# Patient Record
Sex: Female | Born: 1995
Health system: Southern US, Community
[De-identification: ages and names within clinical notes are randomized; demographics above are authoritative.]

---
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
## 2016-02-02 ENCOUNTER — Ambulatory Visit (INDEPENDENT_AMBULATORY_CARE_PROVIDER_SITE_OTHER): Payer: BLUE CROSS/BLUE SHIELD | Admitting: Obstetrics and Gynecology

## 2016-02-02 ENCOUNTER — Other Ambulatory Visit: Payer: Self-pay | Admitting: Obstetrics and Gynecology

## 2016-02-02 ENCOUNTER — Encounter: Payer: Self-pay | Admitting: Obstetrics and Gynecology

## 2016-02-02 VITALS — BP 106/66 | HR 84 | Resp 22 | Ht 61.0 in | Wt 119.8 lb

## 2016-02-02 DIAGNOSIS — Z0000 Encounter for general adult medical examination without abnormal findings: Secondary | ICD-10-CM | POA: Diagnosis not present

## 2016-02-02 DIAGNOSIS — Z01419 Encounter for gynecological examination (general) (routine) without abnormal findings: Secondary | ICD-10-CM

## 2016-02-02 DIAGNOSIS — Z113 Encounter for screening for infections with a predominantly sexual mode of transmission: Secondary | ICD-10-CM | POA: Diagnosis not present

## 2016-02-02 LAB — POCT URINALYSIS DIPSTICK
BILIRUBIN UA: NEGATIVE
GLUCOSE UA: NEGATIVE
Ketones, UA: NEGATIVE
Leukocytes, UA: NEGATIVE
NITRITE UA: NEGATIVE
Protein, UA: NEGATIVE
RBC UA: NEGATIVE
Urobilinogen, UA: NEGATIVE
pH, UA: 5

## 2016-02-02 MED ORDER — ETONOGESTREL-ETHINYL ESTRADIOL 0.12-0.015 MG/24HR VA RING: 1.0000 | VAGINAL_RING | VAGINAL | 11 refills | Status: DC

## 2016-02-02 NOTE — Patient Instructions (Signed)
Health Maintenance, Female Adopting a healthy lifestyle and getting preventive care can go a long way to promote health and wellness. Talk with your health care provider about what schedule of regular examinations is right for you. This is a good chance for you to check in with your provider about disease prevention and staying healthy. In between checkups, there are plenty of things you can do on your own. Experts have done a lot of research about which lifestyle changes and preventive measures are most likely to keep you healthy. Ask your health care provider for more information. WEIGHT AND DIET  Eat a healthy diet  Be sure to include plenty of vegetables, fruits, low-fat dairy products, and lean protein.  Do not eat a lot of foods high in solid fats, added sugars, or salt.  Get regular exercise. This is one of the most important things you can do for your health.  Most adults should exercise for at least 150 minutes each week. The exercise should increase your heart rate and make you sweat (moderate-intensity exercise).  Most adults should also do strengthening exercises at least twice a week. This is in addition to the moderate-intensity exercise.  Maintain a healthy weight  Body mass index (BMI) is a measurement that can be used to identify possible weight problems. It estimates body fat based on height and weight. Your health care provider can help determine your BMI and help you achieve or maintain a healthy weight.  For females 20 years of age and older:   A BMI below 18.5 is considered underweight.  A BMI of 18.5 to 24.9 is normal.  A BMI of 25 to 29.9 is considered overweight.  A BMI of 30 and above is considered obese.  Watch levels of cholesterol and blood lipids  You should start having your blood tested for lipids and cholesterol at 20 years of age, then have this test every 5 years.  You may need to have your cholesterol levels checked more often if:  Your lipid  or cholesterol levels are high.  You are older than 20 years of age.  You are at high risk for heart disease.  CANCER SCREENING   Lung Cancer  Lung cancer screening is recommended for adults 55-80 years old who are at high risk for lung cancer because of a history of smoking.  A yearly low-dose CT scan of the lungs is recommended for people who:  Currently smoke.  Have quit within the past 15 years.  Have at least a 30-pack-year history of smoking. A pack year is smoking an average of one pack of cigarettes a day for 1 year.  Yearly screening should continue until it has been 15 years since you quit.  Yearly screening should stop if you develop a health problem that would prevent you from having lung cancer treatment.  Breast Cancer  Practice breast self-awareness. This means understanding how your breasts normally appear and feel.  It also means doing regular breast self-exams. Let your health care provider know about any changes, no matter how small.  If you are in your 20s or 30s, you should have a clinical breast exam (CBE) by a health care provider every 1-3 years as part of a regular health exam.  If you are 40 or older, have a CBE every year. Also consider having a breast X-ray (mammogram) every year.  If you have a family history of breast cancer, talk to your health care provider about genetic screening.  If you   are at high risk for breast cancer, talk to your health care provider about having an MRI and a mammogram every year.  Breast cancer gene (BRCA) assessment is recommended for women who have family members with BRCA-related cancers. BRCA-related cancers include:  Breast.  Ovarian.  Tubal.  Peritoneal cancers.  Results of the assessment will determine the need for genetic counseling and BRCA1 and BRCA2 testing. Cervical Cancer Your health care provider may recommend that you be screened regularly for cancer of the pelvic organs (ovaries, uterus, and  vagina). This screening involves a pelvic examination, including checking for microscopic changes to the surface of your cervix (Pap test). You may be encouraged to have this screening done every 3 years, beginning at age 21.  For women ages 30-65, health care providers may recommend pelvic exams and Pap testing every 3 years, or they may recommend the Pap and pelvic exam, combined with testing for human papilloma virus (HPV), every 5 years. Some types of HPV increase your risk of cervical cancer. Testing for HPV may also be done on women of any age with unclear Pap test results.  Other health care providers may not recommend any screening for nonpregnant women who are considered low risk for pelvic cancer and who do not have symptoms. Ask your health care provider if a screening pelvic exam is right for you.  If you have had past treatment for cervical cancer or a condition that could lead to cancer, you need Pap tests and screening for cancer for at least 20 years after your treatment. If Pap tests have been discontinued, your risk factors (such as having a new sexual partner) need to be reassessed to determine if screening should resume. Some women have medical problems that increase the chance of getting cervical cancer. In these cases, your health care provider may recommend more frequent screening and Pap tests. Colorectal Cancer  This type of cancer can be detected and often prevented.  Routine colorectal cancer screening usually begins at 20 years of age and continues through 20 years of age.  Your health care provider may recommend screening at an earlier age if you have risk factors for colon cancer.  Your health care provider may also recommend using home test kits to check for hidden blood in the stool.  A small camera at the end of a tube can be used to examine your colon directly (sigmoidoscopy or colonoscopy). This is done to check for the earliest forms of colorectal  cancer.  Routine screening usually begins at age 50.  Direct examination of the colon should be repeated every 5-10 years through 20 years of age. However, you may need to be screened more often if early forms of precancerous polyps or small growths are found. Skin Cancer  Check your skin from head to toe regularly.  Tell your health care provider about any new moles or changes in moles, especially if there is a change in a mole's shape or color.  Also tell your health care provider if you have a mole that is larger than the size of a pencil eraser.  Always use sunscreen. Apply sunscreen liberally and repeatedly throughout the day.  Protect yourself by wearing long sleeves, pants, a wide-brimmed hat, and sunglasses whenever you are outside. HEART DISEASE, DIABETES, AND HIGH BLOOD PRESSURE   High blood pressure causes heart disease and increases the risk of stroke. High blood pressure is more likely to develop in:  People who have blood pressure in the high end   of the normal range (130-139/85-89 mm Hg).  People who are overweight or obese.  People who are African American.  If you are 38-23 years of age, have your blood pressure checked every 3-5 years. If you are 61 years of age or older, have your blood pressure checked every year. You should have your blood pressure measured twice--once when you are at a hospital or clinic, and once when you are not at a hospital or clinic. Record the average of the two measurements. To check your blood pressure when you are not at a hospital or clinic, you can use:  An automated blood pressure machine at a pharmacy.  A home blood pressure monitor.  If you are between 45 years and 39 years old, ask your health care provider if you should take aspirin to prevent strokes.  Have regular diabetes screenings. This involves taking a blood sample to check your fasting blood sugar level.  If you are at a normal weight and have a low risk for diabetes,  have this test once every three years after 20 years of age.  If you are overweight and have a high risk for diabetes, consider being tested at a younger age or more often. PREVENTING INFECTION  Hepatitis B  If you have a higher risk for hepatitis B, you should be screened for this virus. You are considered at high risk for hepatitis B if:  You were born in a country where hepatitis B is common. Ask your health care provider which countries are considered high risk.  Your parents were born in a high-risk country, and you have not been immunized against hepatitis B (hepatitis B vaccine).  You have HIV or AIDS.  You use needles to inject street drugs.  You live with someone who has hepatitis B.  You have had sex with someone who has hepatitis B.  You get hemodialysis treatment.  You take certain medicines for conditions, including cancer, organ transplantation, and autoimmune conditions. Hepatitis C  Blood testing is recommended for:  Everyone born from [birth year] through 1965.  Anyone with known risk factors for hepatitis C. Sexually transmitted infections (STIs)  You should be screened for sexually transmitted infections (STIs) including gonorrhea and chlamydia if:  You are sexually active and are younger than 20 years of age.  You are older than 20 years of age and your health care provider tells you that you are at risk for this type of infection.  Your sexual activity has changed since you were last screened and you are at an increased risk for chlamydia or gonorrhea. Ask your health care provider if you are at risk.  If you do not have HIV, but are at risk, it may be recommended that you take a prescription medicine daily to prevent HIV infection. This is called pre-exposure prophylaxis (PrEP). You are considered at risk if:  You are sexually active and do not regularly use condoms or know the HIV status of your partner(s).  You take drugs by injection.  You are sexually  active with a partner who has HIV. Talk with your health care provider about whether you are at high risk of being infected with HIV. If you choose to begin PrEP, you should first be tested for HIV. You should then be tested every 3 months for as long as you are taking PrEP.  PREGNANCY   If you are premenopausal and you may become pregnant, ask your health care provider about preconception counseling.  If you may  become pregnant, take 400 to 800 micrograms (mcg) of folic acid every day.  If you want to prevent pregnancy, talk to your health care provider about birth control (contraception). OSTEOPOROSIS AND MENOPAUSE   Osteoporosis is a disease in which the bones lose minerals and strength with aging. This can result in serious bone fractures. Your risk for osteoporosis can be identified using a bone density scan.  If you are 61 years of age or older, or if you are at risk for osteoporosis and fractures, ask your health care provider if you should be screened.  Ask your health care provider whether you should take a calcium or vitamin D supplement to lower your risk for osteoporosis.  Menopause may have certain physical symptoms and risks.  Hormone replacement therapy may reduce some of these symptoms and risks. Talk to your health care provider about whether hormone replacement therapy is right for you.  HOME CARE INSTRUCTIONS   Schedule regular health, dental, and eye exams.  Stay current with your immunizations.   Do not use any tobacco products including cigarettes, chewing tobacco, or electronic cigarettes.  If you are pregnant, do not drink alcohol.  If you are breastfeeding, limit how much and how often you drink alcohol.  Limit alcohol intake to no more than 1 drink per day for nonpregnant women. One drink equals 12 ounces of beer, 5 ounces of wine, or 1 ounces of hard liquor.  Do not use street drugs.  Do not share needles.  Ask your health care provider for help if  you need support or information about quitting drugs.  Tell your health care provider if you often feel depressed.  Tell your health care provider if you have ever been abused or do not feel safe at home.   This information is not intended to replace advice given to you by your health care provider. Make sure you discuss any questions you have with your health care provider.   Document Released: 01/11/2011 Document Revised: 07/19/2014 Document Reviewed: 05/30/2013 Elsevier Interactive Patient Education [employer].

## 2016-02-02 NOTE — Progress Notes (Signed)
20 y.o. G0P0000 Single African American female here for annual exam.    Mother present for the visit today.   Wants STD testing and wants to get on birth control.  Also asking for routine labs.  Heavy menses, cramps and headaches.  Headaches just prior to and during menses.  This is mostly for July 2017 and Is not a recurrent issue. Some nausea.  No migraine headaches.   Interested in the Ortho Evra patch.  Uses condoms.   Works for [employer].  Therapist, occupational major.   PCP:  None   Patient's last menstrual period was 01/23/2016 (exact date).     Period Cycle (Days): 30 Period Duration (Days): 5-6 Period Pattern: Regular Menstrual Flow: Heavy Menstrual Control: Tampon, Maxi pad Menstrual Control Change Freq (Hours): every 3 hours on heaviest day Dysmenorrhea: (!) Mild Dysmenorrhea Symptoms: Cramping, Nausea, Headache     Sexually active: Yes.   Female The current method of family planning is condoms most of the time--not currently sexually active.    Exercising: Yes.    runs every other day. Smoker:  no  Health Maintenance: Pap:  never History of abnormal Pap:  n/a MMG:  n/a Colonoscopy:  n/a BMD:   n/a  Result  n/a TDaP:  2015 Gardasil:   no HIV: today. Hep C: today. Screening Labs:  Hb today: 12.1, Urine today: Neg   reports that she has never smoked. She does not have any smokeless tobacco history on file. She reports that she does not drink alcohol or use drugs.  No past medical history on file.  No past surgical history on file.  No current outpatient prescriptions on file.   No current facility-administered medications for this visit.     Family History  Problem Relation Age of Onset  . Diabetes Mother   . Hypertension Mother   . Hyperlipidemia Mother   . Hypertension Father   . Diabetes Maternal Grandmother   . Hypertension Maternal Grandmother   . Hypertension Maternal Grandfather     ROS:  Pertinent items are noted in HPI.  Otherwise, a  comprehensive ROS was negative.  Exam:   BP 106/66 (BP Location: Right Arm, Patient Position: Sitting, Cuff Size: Normal)   Pulse 84   Resp (!) 22   Ht 5\' 1"  (1.549 m)   Wt 119 lb 12.8 oz (54.3 kg)   LMP 01/23/2016 (Exact Date)   BMI 22.64 kg/m     General appearance: alert, cooperative and appears stated age Head: Normocephalic, without obvious abnormality, atraumatic Neck: no adenopathy, supple, symmetrical, trachea midline and thyroid normal to inspection and palpation Lungs: clear to auscultation bilaterally Breasts: normal appearance, no masses or tenderness, Inspection negative, No nipple retraction or dimpling, No nipple discharge or bleeding, No axillary or supraclavicular adenopathy Heart: regular rate and rhythm Abdomen: incisions:  No.    , soft, non-tender; no masses, no organomegaly Extremities: extremities normal, atraumatic, no cyanosis or edema Skin: Skin color, texture, turgor normal. No rashes or lesions Lymph nodes: Cervical, supraclavicular, and axillary nodes normal. No abnormal inguinal nodes palpated Neurologic: Grossly normal  Pelvic: External genitalia:  no lesions              Urethra:  normal appearing urethra with no masses, tenderness or lesions              Bartholins and Skenes: normal                 Vagina: normal appearing vagina with normal  color and discharge, no lesions.  Thick curd like discharge with some odor.  3 - 4 mm skin tag/polyp of the left vaginal side wall.               Cervix: no lesions              Pap taken: No. Bimanual Exam:  Uterus:  normal size, contour, position, consistency, mobility, non-tender              Adnexa: normal adnexa and no mass, fullness, tenderness    Chaperone was present for exam.  Assessment:   Well woman visit with normal exam. Heavy menses.  Dysmenorrhea. Desire for STD screening.  Vaginal skin tag/polyp.  Plan: Yearly mammogram recommended after age 42.  Recommended self breast exam.  Pap and  HR HPV as above. Discussed Calcium, Vitamin D, regular exercise program including cardiovascular and weight bearing exercise. Labs performed.  Yes.  .   See orders.  STD screening including Affirm and general labs. Discussion of birth control options - pills, ring, patch, Depo Provera, Nexplanon, IUDs.  Prescription medication(s) given.  Yes.  .  See orders.  Nuva Ring for 12 months. Instructed in used. Warning signs given.  I discussed risk of DVT, PE, MI, and stroke. Follow up in 3 - 4 months for a recheck and BP check.  Follow up annually and prn.      After visit summary provided.

## 2016-02-03 DIAGNOSIS — Z01419 Encounter for gynecological examination (general) (routine) without abnormal findings: Secondary | ICD-10-CM | POA: Diagnosis not present

## 2016-02-03 DIAGNOSIS — Z113 Encounter for screening for infections with a predominantly sexual mode of transmission: Secondary | ICD-10-CM | POA: Diagnosis not present

## 2016-02-03 LAB — LIPID PANEL
CHOL/HDL RATIO: 2.4 ratio (ref <=5.0)
CHOLESTEROL: 169 mg/dL (ref 125–170)
HDL: 71 mg/dL (ref 36–76)
LDL Cholesterol: 86 mg/dL (ref <110)
Triglycerides: 58 mg/dL (ref 40–136)
VLDL: 12 mg/dL (ref <30)

## 2016-02-03 LAB — COMPREHENSIVE METABOLIC PANEL
ALK PHOS: 50 U/L (ref 47–176)
ALT: 10 U/L (ref 5–32)
AST: 16 U/L (ref 12–32)
Albumin: 4.4 g/dL (ref 3.6–5.1)
BILIRUBIN TOTAL: 0.8 mg/dL (ref 0.2–1.1)
BUN: 9 mg/dL (ref 7–20)
CALCIUM: 9.1 mg/dL (ref 8.9–10.4)
CO2: 24 mmol/L (ref 20–31)
Chloride: 105 mmol/L (ref 98–110)
Creat: 0.73 mg/dL (ref 0.50–1.00)
Glucose, Bld: 95 mg/dL (ref 65–99)
POTASSIUM: 3.9 mmol/L (ref 3.8–5.1)
Sodium: 138 mmol/L (ref 135–146)
TOTAL PROTEIN: 7.2 g/dL (ref 6.3–8.2)

## 2016-02-03 LAB — CBC
HEMATOCRIT: 37 % (ref 35.0–45.0)
Hemoglobin: 12.1 g/dL (ref 11.7–15.5)
MCH: 27.8 pg (ref 27.0–33.0)
MCHC: 32.7 g/dL (ref 32.0–36.0)
MCV: 85.1 fL (ref 80.0–100.0)
MPV: 12.6 fL — AB (ref 7.5–12.5)
PLATELETS: 189 10*3/uL (ref 140–400)
RBC: 4.35 MIL/uL (ref 3.80–5.10)
RDW: 14.9 % (ref 11.0–15.0)
WBC: 6.2 10*3/uL (ref 3.8–10.8)

## 2016-02-03 LAB — STD PANEL
HIV 1&2 Ab, 4th Generation: NONREACTIVE
Hepatitis B Surface Ag: NEGATIVE

## 2016-02-03 LAB — HEMOGLOBIN, FINGERSTICK: Hemoglobin, fingerstick: 12.1 g/dL (ref 12.0–16.0)

## 2016-02-03 LAB — TSH: TSH: 1.53 mIU/L (ref 0.50–4.30)

## 2016-02-03 LAB — GC/CHLAMYDIA PROBE AMP
CT PROBE, AMP APTIMA: NOT DETECTED
GC PROBE AMP APTIMA: NOT DETECTED

## 2016-02-03 LAB — HEPATITIS C ANTIBODY: HCV Ab: NEGATIVE

## 2016-02-04 ENCOUNTER — Telehealth: Payer: Self-pay

## 2016-02-04 LAB — WET PREP BY MOLECULAR PROBE
Candida species: POSITIVE — AB
Gardnerella vaginalis: POSITIVE — AB
Trichomonas vaginosis: NEGATIVE

## 2016-02-04 MED ORDER — METRONIDAZOLE 500 MG PO TABS: 500.0000 mg | ORAL_TABLET | Freq: Two times a day (BID) | ORAL | 0 refills | Status: DC

## 2016-02-04 MED ORDER — FLUCONAZOLE 150 MG PO TABS: 150.0000 mg | ORAL_TABLET | Freq: Once | ORAL | 0 refills | Status: AC

## 2016-02-04 NOTE — Telephone Encounter (Signed)
Spoke with patient. Results and message as seen below given to patient. Patient is agreeable and verbalizes understanding. Rx for Diflucan 150 mg orally x 1, repeat in 72 hours if symptoms persist #2 0RF and Flagyl 500 mg bid x 7 days #14 0RF sent to pharmacy on file. ETOH precautions given. Patient is agreeable. Recheck appointment scheduled for 06/07/16 with Dr.Silva.  Routing to provider for final review. Patient agreeable to disposition. Will close encounter.

## 2016-02-04 NOTE — Telephone Encounter (Signed)
-----   Message from Nunzio Cobbs, MD sent at 02/04/2016  6:12 AM EDT ----- Please report results to patient.  She has both yeast and bacterial vaginosis on her Affirm testing.  I am recommending:  Diflucan 150 mg orally x 1 with repeat in 72 hours if needed.  Dispense - 2.  RF none. Flagyl 500 mg po bid for one week.  Dispense - 14.  RF none. Please sent to pharmacy of choice.   Her STD testing is negative for HIV, syphilis, hep B and C, and gonorrhea and chlamydia.  CBC is normal.   Her results for her cholesterol, blood chemistries, and thyroid are all normal.  These came through separately in another result.  Please schedule a follow up visit with me for October or November.  She was just started on the NuvaRing.  Cc- Marisa Sprinkles

## 2016-05-05 DIAGNOSIS — M79671 Pain in right foot: Secondary | ICD-10-CM | POA: Diagnosis not present

## 2016-06-07 ENCOUNTER — Ambulatory Visit: Payer: BLUE CROSS/BLUE SHIELD | Admitting: Obstetrics and Gynecology

## 2016-09-10 ENCOUNTER — Telehealth: Payer: Self-pay | Admitting: Obstetrics and Gynecology

## 2016-09-10 NOTE — Telephone Encounter (Signed)
Patient calling to discuss removing the nuvaring and getting a new form of birth control.

## 2016-09-10 NOTE — Telephone Encounter (Signed)
Trouble with patient phone connection, will return call.

## 2016-09-10 NOTE — Telephone Encounter (Signed)
Spoke with patient. Patient states she would like to change contraception from [place] , no further information provided. Patient states she will be in town for spring break 3/10-3/16. Last AEX 02/02/16, recommended OV to discuss options with Melvia Heaps, CNM, patient scheduled for 3/14 at 10am. Patient is agreeable to date and time.  Routing to provider for final review. Patient is agreeable to disposition. Will close encounter.

## 2016-09-22 ENCOUNTER — Ambulatory Visit (INDEPENDENT_AMBULATORY_CARE_PROVIDER_SITE_OTHER): Payer: BLUE CROSS/BLUE SHIELD | Admitting: Certified Nurse Midwife

## 2016-09-22 ENCOUNTER — Encounter: Payer: Self-pay | Admitting: Certified Nurse Midwife

## 2016-09-22 VITALS — BP 112/80 | HR 64 | Resp 16 | Ht 61.0 in | Wt 131.0 lb

## 2016-09-22 DIAGNOSIS — Z3009 Encounter for other general counseling and advice on contraception: Secondary | ICD-10-CM | POA: Diagnosis not present

## 2016-09-22 NOTE — Progress Notes (Signed)
20 y.o. Single African American G0P0000 here for evaluation of Nuvaring use and discussion of another option for contraception. initiated on 02/02/16 for contraception. Has been using with out any problems until the last 4 months.Menses duration 7 days with       flow. Patient taking medication as prescribed. Denies missed or late insertion of Nuvaring, headaches, nausea, DVT warning signs or symptoms,  breakthrough bleeding. She has experienced vaginal irritation after insertion of new ring for 2 weeks each month for the past 4 months.This occurs, both internal and external. When Nuvaring removed this stops after 2 days. She is tired of dealing with this and would like to use Depo Provera. Sister uses this without any problems other than weight gain.  Keeping menses calendar. Has friends with IUD and Nexplanon and does not feel this is right for her. She works out at gym daily and is very conscious of her weight, so feels this will not be a problem. No other health issues today  O: Healthy female, WD WN Affect: normal orientation X 3    A: History of vaginal irritation and itching with Nuvaring use, desires Depo Provera trial No contraindication of Depo Provera use  P: Discussed possible vaginal infection with Nuvaring use, but patient does not feel this is the case. Has not symptoms today with Nuvaring out. Discussed risks and benefits of Depo Provera and given every 3 months. Discussed bleeding profile expectations. Questions addressed at length. Patient would like trial of Depo Provera. LMP ended 3-4 days ago. Plans condom use until starts Depo Provera and aware she will need to use after injection. Instructed needs to call with next period, so she can start Depo on  day 1-5 of cycle. Patient voiced understanding and will call. Given written literature regarding Depo.  Rv as above, prn  29 minutes in face to face consult regarding contraception change

## 2016-09-22 NOTE — Patient Instructions (Signed)

## 2016-09-27 NOTE — Progress Notes (Signed)
Encounter reviewed Jill Jertson, MD   

## 2016-10-18 ENCOUNTER — Telehealth: Payer: Self-pay | Admitting: Certified Nurse Midwife

## 2016-10-18 NOTE — Telephone Encounter (Signed)
Spoke with patient. Patient states that she started her menses today and would like to schedule depo provera injection. Advised this will need to be given by Friday 10/22/2016. Patient is out of town for school and cannot come to town until Friday. Appointment scheduled for 10/22/2016 at 2 pm. Patient is agreeable to date and time.  Routing to provider for final review. Patient agreeable to disposition. Will close encounter.

## 2016-10-18 NOTE — Telephone Encounter (Signed)
Patient's period started today and she's calling to schedule the depo shot. She's currently out of town and will be back on Friday.

## 2016-10-22 ENCOUNTER — Ambulatory Visit (INDEPENDENT_AMBULATORY_CARE_PROVIDER_SITE_OTHER): Payer: BLUE CROSS/BLUE SHIELD

## 2016-10-22 VITALS — BP 118/60 | HR 88 | Resp 16 | Wt 133.0 lb

## 2016-10-22 DIAGNOSIS — Z3042 Encounter for surveillance of injectable contraceptive: Secondary | ICD-10-CM

## 2016-10-22 MED ORDER — MEDROXYPROGESTERONE ACETATE 150 MG/ML IM SUSP
150.0000 mg | Freq: Once | INTRAMUSCULAR | Status: AC
  Administered 2016-10-22: 150 mg via INTRAMUSCULAR

## 2016-10-22 NOTE — Progress Notes (Signed)
Patient is here for Depo Provera Injection Patient is within Depo Provera Calender Limits first depo: okay per DL Per patient, has had unprotected sex since last visit with DL. Spoke with Dr. Quincy Simmonds, okay to administer injection. Patient is currently on period. Start date: 10/18/16 Next Depo Due between: 6/29-7/13 Last AEX: 02/02/16 BS AEX Scheduled: not scheduled  Patient is aware when next depo is due  Pt tolerated Injection well in [place].  Routed to provider for review, encounter closed.

## 2016-12-08 ENCOUNTER — Telehealth: Payer: Self-pay | Admitting: Certified Nurse Midwife

## 2016-12-08 NOTE — Telephone Encounter (Signed)
Spoke with patient. Patient reports starting depo-provera in April 2018, has been bleeding for 3 weeks, would like to know if this is normal. Patient reports changing a pad 2 times per day, denies fatigue, dizziness, weakness, pain. Patient states no chance of pregnancy. Advised patient can take 3 months with new contraceptive for cycles to regulate, continue to monitor. Return call if bleeding becomes heavy -changing pad or tampon q1-2 hours or if fatigue/dizziness/lightheadness develop. Advised patient would review with Melvia Heaps, CNM and return call with any additional recommendations, patient is agreeable.  Melvia Heaps, CNM -any additional recommendations?

## 2016-12-08 NOTE — Telephone Encounter (Signed)
Left message to call Alya Smaltz at [phone].  

## 2016-12-08 NOTE — Telephone Encounter (Signed)
Patient is asking to talk with a nurse regarding her depo provera. Patient is having irregular bleeding.

## 2016-12-08 NOTE — Telephone Encounter (Signed)
Agree with plan. This is not unusual with first using Depo Provera

## 2017-01-07 ENCOUNTER — Ambulatory Visit (INDEPENDENT_AMBULATORY_CARE_PROVIDER_SITE_OTHER): Payer: BLUE CROSS/BLUE SHIELD

## 2017-01-07 VITALS — BP 104/64 | HR 68 | Resp 16 | Ht 61.0 in | Wt 136.0 lb

## 2017-01-07 DIAGNOSIS — Z3042 Encounter for surveillance of injectable contraceptive: Secondary | ICD-10-CM | POA: Diagnosis not present

## 2017-01-07 MED ORDER — MEDROXYPROGESTERONE ACETATE 150 MG/ML IM SUSP
150.0000 mg | Freq: Once | INTRAMUSCULAR | Status: AC
Start: 2017-01-07 — End: 2017-01-07
  Administered 2017-01-07: 150 mg via INTRAMUSCULAR

## 2017-01-07 NOTE — Progress Notes (Signed)
Patient is here for Depo Provera Injection Patient is within Depo Provera Calender Limits 01-07-17 thru 01-21-17 Next Depo Due between: 03-25-17 thru 04-08-17 Last AEX: 02-02-16 with deborah leonard,cnm AEX Scheduled: not scheduled pt aware needs to be scheduled before next depo.pt to schedule  Patient is aware when next depo is due  Pt tolerated Injection well. Given in [place]  Routed to provider for review, encounter closed.

## 2017-01-31 ENCOUNTER — Telehealth: Payer: Self-pay | Admitting: Obstetrics and Gynecology

## 2017-01-31 NOTE — Telephone Encounter (Signed)
Patient called and requested to speak with the nurse about her birth control.

## 2017-01-31 NOTE — Telephone Encounter (Signed)
Please move up the patient's annual exam with me. She has her appointment with me the beginning of August. How about tomorrow morning instead?  (Her last annual was 03/04/16.) I can assess her bleeding then and present options.

## 2017-01-31 NOTE — Telephone Encounter (Signed)
Spoke with patient. Patient states since her first Depo Provera injection in April she has been having irregular bleeding. Most recent Depo was given on 01/07/17. Has been having ongoing light bleeding until 2 weeks ago. Bleeding increased to a normal menses flow. Is changing her tampon every 4 hours. Reports bleeding is not getting lighter. Denies any fatigue, weakness, or light headedness. Denies any chance for pregnancy. States she was advised to contact the office if bleeding increased so she could potentially get started on a medication to help regulate her bleeding.  Routing to [place] for review and Melvia Heaps CNM is out of the office.

## 2017-01-31 NOTE — Telephone Encounter (Signed)
Spoke with patient. Advised of recommendations from Dr. Quincy Simmonds. Patient is agreeable. Last aex was 02/02/16. This years aex needs to be 1 year and 1 day out for insurance coverage. Aex scheduled for 02/02/2017 at 8:15 am with Dr.Silva. Patient is agreeable to date and time.  Routing to provider for final review. Patient agreeable to disposition. Will close encounter.

## 2017-02-02 ENCOUNTER — Encounter: Payer: Self-pay | Admitting: Obstetrics and Gynecology

## 2017-02-02 ENCOUNTER — Ambulatory Visit (INDEPENDENT_AMBULATORY_CARE_PROVIDER_SITE_OTHER): Payer: BLUE CROSS/BLUE SHIELD | Admitting: Obstetrics and Gynecology

## 2017-02-02 VITALS — BP 108/70 | HR 84 | Resp 16 | Ht 62.0 in | Wt 137.0 lb

## 2017-02-02 DIAGNOSIS — Z23 Encounter for immunization: Secondary | ICD-10-CM | POA: Diagnosis not present

## 2017-02-02 DIAGNOSIS — Z01419 Encounter for gynecological examination (general) (routine) without abnormal findings: Secondary | ICD-10-CM

## 2017-02-02 DIAGNOSIS — N939 Abnormal uterine and vaginal bleeding, unspecified: Secondary | ICD-10-CM | POA: Diagnosis not present

## 2017-02-02 DIAGNOSIS — Z113 Encounter for screening for infections with a predominantly sexual mode of transmission: Secondary | ICD-10-CM

## 2017-02-02 DIAGNOSIS — R946 Abnormal results of thyroid function studies: Secondary | ICD-10-CM | POA: Diagnosis not present

## 2017-02-02 DIAGNOSIS — R7989 Other specified abnormal findings of blood chemistry: Secondary | ICD-10-CM

## 2017-02-02 LAB — POCT URINE PREGNANCY: Preg Test, Ur: NEGATIVE

## 2017-02-02 MED ORDER — ESTRADIOL 0.5 MG PO TABS: 0.5000 mg | ORAL_TABLET | Freq: Every day | ORAL | 0 refills | Status: DC

## 2017-02-02 NOTE — Progress Notes (Signed)
21 y.o. G0P0000 Single African American female here for annual exam.  Patient complains of heavy bleeding after getting last depo injection. Would like to discuss other options Has received 2 depo injections.  States she was spotting after the first injection.  This has increased with the second injection and to the point of changing a tampon 3 times per day.  Seems to wax and wane per patient.  Other than the bleeding, the patient likes the Depo.    She wants to do a break after this Depo Provera injection.   In the past has used NuvaRing.  She states she had irregular bleeding with the NuvaRing.   She started on contraception in July 2017 for dysmenorrhea and heavy cycles.  Is sexually active.  No new partner.   UPT -   PCP:   No PCP  No LMP recorded. Patient has had an injection.           Sexually active: Yes.    The current method of family planning is Depo-Provera injections.    Exercising: No.  The patient does not participate in regular exercise at present. Smoker:  no  Health Maintenance: Pap:  Never History of abnormal Pap:  n/a MMG:  N/a Colonoscopy:  n/a BMD:   n/a  Result  n/a TDaP:  2015 Gardasil:  unsure HIV and Hep C: 02/02/16 Negative Screening Labs:     reports that she has never smoked. She has never used smokeless tobacco. She reports that she does not drink alcohol or use drugs.  History reviewed. No pertinent past medical history.  History reviewed. No pertinent surgical history.  Current Outpatient Prescriptions  Medication Sig Dispense Refill  . medroxyPROGESTERone (DEPO-PROVERA) 150 MG/ML injection Inject 150 mg into the muscle every 3 (three) months.     No current facility-administered medications for this visit.     Family History  Problem Relation Age of Onset  . Diabetes Mother   . Hypertension Mother   . Hyperlipidemia Mother   . Hypertension Father   . Diabetes Maternal Grandmother   . Hypertension Maternal Grandmother   .  Hypertension Maternal Grandfather     ROS:  Pertinent items are noted in HPI.  Otherwise, a comprehensive ROS was negative.  Exam:   BP 108/70 (BP Location: Right Arm, Patient Position: Sitting, Cuff Size: Normal)   Pulse 84   Resp 16   Ht 5\' 2"  (1.575 m)   Wt 137 lb (62.1 kg)   BMI 25.06 kg/m     General appearance: alert, cooperative and appears stated age Head: Normocephalic, without obvious abnormality, atraumatic Neck: no adenopathy, supple, symmetrical, trachea midline and thyroid normal to inspection and palpation Lungs: clear to auscultation bilaterally Breasts: normal appearance, no masses or tenderness, No nipple retraction or dimpling, No nipple discharge or bleeding, No axillary or supraclavicular adenopathy Heart: regular rate and rhythm Abdomen: soft, non-tender; no masses, no organomegaly Extremities: extremities normal, atraumatic, no cyanosis or edema Skin: Skin color, texture, turgor normal. No rashes or lesions Lymph nodes: Cervical, supraclavicular, and axillary nodes normal. No abnormal inguinal nodes palpated Neurologic: Grossly normal  Pelvic: External genitalia:  no lesions              Urethra:  normal appearing urethra with no masses, tenderness or lesions              Bartholins and Skenes: normal                 Vagina: normal  appearing vagina with normal color and discharge, no lesions.  Brownish blood.               Cervix: no lesions              Pap taken: No. Bimanual Exam:  Uterus:  normal size, contour, position, consistency, mobility, non-tender              Adnexa: no mass, fullness, tenderness                Chaperone was present for exam.  Assessment:   Well woman visit with normal exam. Abnormal uterine bleeding.  Had irregular bleeding with NuvaRing and Depo but not prior to this.  STD screening.   Plan: Mammogram screening discussed. Recommended self breast awareness. Pap and HR HPV as above. Guidelines for Calcium, Vitamin D,  regular exercise program including cardiovascular and weight bearing exercise. STD testing, CBC, and TSH.  Start [facility] series.  Discussed with patient.  If UPT negative now, will give Gardasil and also Rx for Estrace 0.5 mg daily for one month.  She will report back how her menstrual cycles are doing.  Follow up annually and prn.   After visit summary provided.

## 2017-02-02 NOTE — Patient Instructions (Signed)

## 2017-02-03 LAB — CBC
HEMOGLOBIN: 12.5 g/dL (ref 11.1–15.9)
Hematocrit: 39.4 % (ref 34.0–46.6)
MCH: 28 pg (ref 26.6–33.0)
MCHC: 31.7 g/dL (ref 31.5–35.7)
MCV: 88 fL (ref 79–97)
Platelets: 150 10*3/uL (ref 150–379)
RBC: 4.47 x10E6/uL (ref 3.77–5.28)
RDW: 14.6 % (ref 12.3–15.4)
WBC: 4.3 10*3/uL (ref 3.4–10.8)

## 2017-02-03 LAB — HEP, RPR, HIV PANEL
HEP B S AG: NEGATIVE
HIV SCREEN 4TH GENERATION: NONREACTIVE
RPR Ser Ql: NONREACTIVE

## 2017-02-03 LAB — TSH: TSH: 4.87 u[IU]/mL — ABNORMAL HIGH (ref 0.450–4.500)

## 2017-02-03 LAB — HEPATITIS C ANTIBODY: Hep C Virus Ab: 0.7 s/co ratio (ref 0.0–0.9)

## 2017-02-04 ENCOUNTER — Other Ambulatory Visit: Payer: Self-pay | Admitting: *Deleted

## 2017-02-04 ENCOUNTER — Telehealth: Payer: Self-pay | Admitting: Obstetrics and Gynecology

## 2017-02-04 LAB — VAGINITIS/VAGINOSIS, DNA PROBE
CANDIDA SPECIES: NEGATIVE
Gardnerella vaginalis: POSITIVE — AB
Trichomonas vaginosis: NEGATIVE

## 2017-02-04 NOTE — Telephone Encounter (Signed)
Phone call to patient.  Her Affirm was positive for BV and negative for yeast and trichomonas. She is not having any symptoms of discharge, odor, or burning.  She declines tx at this time.   Her TSH was elevated and I asked to add free T3 and free T4 to her lab work already drawn.  I explained that we need this information to determine if she has hypothyroidism and if she needs treatment for this.  If thyroid hormone levels are normal, will retest TSH in 6 weeks.

## 2017-02-06 NOTE — Addendum Note (Signed)
Addended by: Yisroel Ramming, BROOK E on: 02/06/2017 11:21 AM   Modules accepted: Orders

## 2017-02-07 ENCOUNTER — Telehealth: Payer: Self-pay | Admitting: *Deleted

## 2017-02-07 LAB — GC/CHLAMYDIA PROBE AMP

## 2017-02-07 NOTE — Telephone Encounter (Signed)
Notes recorded by Burnice Logan, RN on 02/07/2017 at 11:45 AM EDT Left message to call [name] at [phone].   Notes recorded by Nunzio Cobbs, MD on 02/06/2017 at 11:21 AM EDT Please contact patient with her final thyroid testing.  She had a mildly elevated TSH, and her thyroid hormone levels are normal. She needs to have her TSH rechecked again in 6 weeks.  Please schedule this lab visit.  I will place a future order. ------

## 2017-02-07 NOTE — Telephone Encounter (Signed)
Patient returning your call.  States it is ok to leave a detailed message.

## 2017-02-07 NOTE — Telephone Encounter (Signed)
Call to patient results reviewed with patient and she verbalized understanding. Patient scheduled to have TSH rechecked on Friday 03/25/17 at 1430. Patient agreeable to date and time of appointment. Future order present for blood work.   Patient agreeable to disposition. Will close encounter.

## 2017-02-08 LAB — T3, FREE: T3 FREE: 4.1 pg/mL (ref 2.0–4.4)

## 2017-02-08 LAB — T4, FREE: Free T4: 1.44 ng/dL (ref 0.82–1.77)

## 2017-02-08 LAB — SPECIMEN STATUS REPORT

## 2017-02-08 NOTE — Addendum Note (Signed)
Addended by: Yisroel Ramming, BROOK E on: 02/08/2017 10:52 AM   Modules accepted: Orders

## 2017-02-09 ENCOUNTER — Ambulatory Visit: Payer: BLUE CROSS/BLUE SHIELD | Admitting: Obstetrics and Gynecology

## 2017-02-28 ENCOUNTER — Telehealth: Payer: Self-pay | Admitting: Obstetrics and Gynecology

## 2017-02-28 NOTE — Telephone Encounter (Signed)
I would recommend no further Estrace at this time until further evaluation.  If the bleeding becomes heavy with pad change every 1 - 2 hours, I recommend moving up the office visit.

## 2017-02-28 NOTE — Telephone Encounter (Signed)
Spoke with patient. Patient is currently on Depo Provera for birth control. Has had 2 injections. Last injection given on 12/28/16. Was having ongoing bleeding after 2nd injection and was seen in the office on 02/02/17 for evaluation with Dr.Silva. Patient was started on Estrace 0.5 mg daily. Reports she has not had any bleeding until 02/25/17. Bleeding has returned. Reports passing multiple clots. Wearing a pad she changes 1-2 times daily. Denies any heavy bleeding. Next injection is due 03/31/17. Patient is concerned about bleeding returning and persisting like before starting Estrace. Advised will review with Dr.Silva and return call with recommendations.

## 2017-02-28 NOTE — Telephone Encounter (Signed)
Please schedule appointment for the patient to see me for a recheck.

## 2017-02-28 NOTE — Telephone Encounter (Signed)
Spoke with patient. Advised of message as seen below from [place]. Appointment scheduled for 03/11/17 at 3 pm with Dr.Silva. Patient is agreeable to date and time. Declines all earlier appointments due to work and school schedule.  Dr.Silva, would you like me to refill Estrace for 1 month until she is able to be seen?

## 2017-02-28 NOTE — Telephone Encounter (Signed)
Patient has a question about her birth control. °

## 2017-03-01 NOTE — Telephone Encounter (Signed)
Spoke with patient. Advised of message as seen below from Dr.Silva. Patient verbalizes understanding. 

## 2017-03-10 NOTE — Progress Notes (Signed)
GYNECOLOGY  VISIT   HPI: 21 y.o.   Single  African American  female   [place] with Patient's last menstrual period was 02/23/2017.   here for Abnormal uterine bleeding f/u and recheck TSH/urine GC.  Did a one month course of Estrace 0.5 mg for irregular bleeding on Depo Provera.  This stopped the bleeding for 3 weeks, and then it recurred, but now she ran out.  Still bleeding today but it is light.  No pain or discomfort.   Depo Provera is due next week.  This would be her third injection.  Wants to continue this and the estrogen pills.  Going to the beach next week.   State she did have irregular bleeding on NuvaRing also.   GYNECOLOGIC HISTORY: Patient's last menstrual period was 02/23/2017. Contraception:  Depo-Provera Menopausal hormone therapy:  n/a Last mammogram:  n/a Last pap smear:   n/a        OB History    Gravida Para Term Preterm AB Living   0 0 0 0 0 0   SAB TAB Ectopic Multiple Live Births   0 0 0 0 0         There are no active problems to display for this patient.   History reviewed. No pertinent past medical history.  History reviewed. No pertinent surgical history.  Current Outpatient Prescriptions  Medication Sig Dispense Refill  . medroxyPROGESTERone (DEPO-PROVERA) 150 MG/ML injection Inject 150 mg into the muscle every 3 (three) months.    Marland Kitchen estradiol (ESTRACE) 0.5 MG tablet Take 1 tablet (0.5 mg total) by mouth daily. (Patient not taking: Reported on 03/11/2017) 30 tablet 0   No current facility-administered medications for this visit.      ALLERGIES: Patient has no known allergies.  Family History  Problem Relation Age of Onset  . Diabetes Mother   . Hypertension Mother   . Hyperlipidemia Mother   . Hypertension Father   . Diabetes Maternal Grandmother   . Hypertension Maternal Grandmother   . Hypertension Maternal Grandfather     Social History   Social History  . Marital status: Single    Spouse name: N/A  . Number of  children: N/A  . Years of education: N/A   Occupational History  . Not on file.   Social History Main Topics  . Smoking status: Never Smoker  . Smokeless tobacco: Never Used  . Alcohol use No  . Drug use: No  . Sexual activity: Yes    Partners: Male    Birth control/ protection: Injection   Other Topics Concern  . Not on file   Social History Narrative  . No narrative on file    ROS:  Pertinent items are noted in HPI.  PHYSICAL EXAMINATION:    BP 100/70 (BP Location: Right Arm, Patient Position: Sitting, Cuff Size: Normal)   Pulse 96   Resp 16   Wt 138 lb (62.6 kg)   LMP 02/23/2017   BMI 25.24 kg/m     General appearance: alert, cooperative and appears stated age  Pelvic: External genitalia:  no lesions              Urethra:  normal appearing urethra with no masses, tenderness or lesions              Bartholins and Skenes: normal                 Vagina: normal appearing vagina with normal color and discharge, no lesions  Cervix: no lesions.  Small amount of old blood.                 Bimanual Exam:  Uterus:  normal size, contour, position, consistency, mobility, non-tender              Adnexa: no mass, fullness, tenderness               Chaperone was present for exam.  ASSESSMENT  Irregular bleeding on Depo Provera.  Bleeding was improved but not completely resolved on Estrace 0.5 mg.  Elevated TSH.  PLAN  UPT. Depo Provera 150 mg today if UPT negative.  Urine GC/CT. OK for Estrace 1 mg daily x 3 months.  If irregular bleeding persists, will do pelvic US.  TSH, free T3 and free T4.   An After Visit Summary was printed and given to the patient.  __15____ minutes face to face time of which over 50% was spent in counseling.

## 2017-03-11 ENCOUNTER — Ambulatory Visit (INDEPENDENT_AMBULATORY_CARE_PROVIDER_SITE_OTHER): Payer: BLUE CROSS/BLUE SHIELD | Admitting: Obstetrics and Gynecology

## 2017-03-11 ENCOUNTER — Encounter: Payer: Self-pay | Admitting: Obstetrics and Gynecology

## 2017-03-11 VITALS — BP 100/70 | HR 96 | Resp 16 | Wt 138.0 lb

## 2017-03-11 DIAGNOSIS — R7989 Other specified abnormal findings of blood chemistry: Secondary | ICD-10-CM

## 2017-03-11 DIAGNOSIS — R946 Abnormal results of thyroid function studies: Secondary | ICD-10-CM | POA: Diagnosis not present

## 2017-03-11 DIAGNOSIS — Z113 Encounter for screening for infections with a predominantly sexual mode of transmission: Secondary | ICD-10-CM

## 2017-03-11 DIAGNOSIS — Z3042 Encounter for surveillance of injectable contraceptive: Secondary | ICD-10-CM

## 2017-03-11 DIAGNOSIS — N926 Irregular menstruation, unspecified: Secondary | ICD-10-CM

## 2017-03-11 LAB — POCT URINE PREGNANCY: PREG TEST UR: NEGATIVE

## 2017-03-11 MED ORDER — MEDROXYPROGESTERONE ACETATE 150 MG/ML IM SUSP
150.0000 mg | Freq: Once | INTRAMUSCULAR | Status: AC
  Administered 2017-03-11: 150 mg via INTRAMUSCULAR

## 2017-03-11 MED ORDER — ESTRADIOL 1 MG PO TABS: 1.0000 mg | ORAL_TABLET | Freq: Every day | ORAL | 2 refills | Status: DC

## 2017-03-12 LAB — T3, FREE: T3, Free: 3.3 pg/mL (ref 2.0–4.4)

## 2017-03-12 LAB — TSH: TSH: 1.68 u[IU]/mL (ref 0.450–4.500)

## 2017-03-12 LAB — T4, FREE: FREE T4: 1.26 ng/dL (ref 0.82–1.77)

## 2017-03-13 LAB — GC/CHLAMYDIA PROBE AMP
Chlamydia trachomatis, NAA: NEGATIVE
Neisseria gonorrhoeae by PCR: NEGATIVE

## 2017-03-25 ENCOUNTER — Ambulatory Visit: Payer: BLUE CROSS/BLUE SHIELD

## 2017-03-25 ENCOUNTER — Other Ambulatory Visit: Payer: Self-pay

## 2017-04-08 ENCOUNTER — Ambulatory Visit: Payer: BLUE CROSS/BLUE SHIELD

## 2017-04-13 ENCOUNTER — Telehealth: Payer: Self-pay | Admitting: Obstetrics and Gynecology

## 2017-04-13 NOTE — Telephone Encounter (Signed)
Please have patient return to the office for a pelvic ultrasound and recheck with me.  She is having abnormal bleeding on various contraceptives. Do not start the estradiol at this time.  I can see her tomorrow morning if that works for her.   Danielle Phelps

## 2017-04-13 NOTE — Telephone Encounter (Signed)
Call to patient. States she cannot come in for appointment. Offered Friday, Monday and multiple options. Patient declines all appointments until Friday 04-22-17. Instructed to call back if decides she can come sooner.  Given precautions to go to urgent care or ED if increased bleeding > I pad per hour, dizziness, lightheaded, SOB or palpitations. Currently denies these symptoms.  Advised not to restart Estradiol at this time per Dr Quincy Simmonds.  Advised will update Dr Quincy Simmonds on appointment plans and will call her back if any additional instructions.   Routing to provider for final review. Patient agreeable to disposition. Will close encounter.

## 2017-04-13 NOTE — Telephone Encounter (Signed)
Call to patient. Discussed option of ultrasound tomorrow morning. Patient will check with family and see if she can make arrangements for this and call back.

## 2017-04-13 NOTE — Telephone Encounter (Signed)
Spoke with patient. Reports is on depo-provera, takes estradiol 1mg  daily for irregular bleeding.   Ran out of estradiol for 3 days, bleeding restarted during this time. Reports flow as heavy, has changed tampon x1 today. Patient states she picked up nex prescription, was unsure if she could restart estradiol?  Reports prior to stopping estradiol 1 mg daily, no irregular bleeding.  Advised patient can restart estradiol today, will update Dr. Quincy Simmonds and return call with any additional recommendations. Patient is agreeable.  Dr. Quincy Simmonds -any additional recommendations?

## 2017-04-13 NOTE — Telephone Encounter (Signed)
Patient on Depo and estradiol and has some questions.

## 2017-04-14 ENCOUNTER — Telehealth: Payer: Self-pay | Admitting: Obstetrics and Gynecology

## 2017-04-14 NOTE — Telephone Encounter (Signed)
Spoke with patient, returning call about estradiol. Patient states she was advised not to restart estradiol 1mg  po, unable to come in for OV until next week, 10/12.  Reports bleeding as "heavy", changing tampon 2 times per day. Denies SHOB, dizziness, weakness or fatigue.  Advised patient per review of telephone encounter dated 10/3 -Dr. Quincy Simmonds advised not to restart estradiol. Will review with Dr. Quincy Simmonds and return call with any additional recommendations, patient is agreeable.   Dr. Quincy Simmonds -any additional recommendations?

## 2017-04-14 NOTE — Telephone Encounter (Signed)
Reviewed with Dr. Quincy Simmonds. Advised not to restart estradiol until further evaluation of abnormal bleeding is completed.   Spoke with patient, advised as seen above. Attempted to move OV to earlier date, patient declined.   Precautions reviewed -seek care at local ER/Urgent care should bleeding increase, changing saturated pad/tampon q1-2 hours, dizziness, lightheaded, SHOB or palpitations.  Patient verbalizes understanding and is agreeable.  Routing to provider for final review. Patient is agreeable to disposition. Will close encounter.

## 2017-04-14 NOTE — Telephone Encounter (Signed)
Patient called with questions about her medications.

## 2017-04-19 ENCOUNTER — Telehealth: Payer: Self-pay | Admitting: Obstetrics and Gynecology

## 2017-04-19 NOTE — Telephone Encounter (Signed)
Left message to call Oryon Gary at [phone].  

## 2017-04-19 NOTE — Telephone Encounter (Signed)
Please contact patient in follow up to her cancelled appointment.  I would like an update on her bleeding.   [place]

## 2017-04-19 NOTE — Telephone Encounter (Signed)
Patient cancelled office visit appointment for abnormal bleeding for Friday. Says she is still having the issue but she has something else to do.

## 2017-04-21 NOTE — Telephone Encounter (Signed)
Spoke with patient. Patient states she would like to reschedule appointment for 10/12, will be back in town after 11am. Scheduled for 1:15pm with Dr. Quincy Simmonds. Patient states her bleeding is "unchanged, still heavy". Changing tampon 2 times per day. Patient states she took estradiol x1 on 10/10 "to see if there would be any difference in bleeding", reports no changes.   Advised patient of recommendations per Dr. Quincy Simmonds, not to restart estradiol until further evaluation of abnormal bleeding is completed. ER precautions reviewed. Patient verbalizes understanding.   Routing to provider for final review. Patient is agreeable to disposition. Will close encounter.

## 2017-04-22 ENCOUNTER — Ambulatory Visit: Payer: Self-pay | Admitting: Obstetrics and Gynecology

## 2017-04-22 ENCOUNTER — Ambulatory Visit: Payer: BLUE CROSS/BLUE SHIELD | Admitting: Obstetrics and Gynecology

## 2017-04-25 ENCOUNTER — Encounter: Payer: Self-pay | Admitting: Obstetrics and Gynecology

## 2017-04-25 ENCOUNTER — Ambulatory Visit (INDEPENDENT_AMBULATORY_CARE_PROVIDER_SITE_OTHER): Payer: BLUE CROSS/BLUE SHIELD | Admitting: Obstetrics and Gynecology

## 2017-04-25 VITALS — BP 116/70 | HR 84 | Temp 98.6°F | Resp 16 | Wt 144.0 lb

## 2017-04-25 DIAGNOSIS — N926 Irregular menstruation, unspecified: Secondary | ICD-10-CM

## 2017-04-25 DIAGNOSIS — R3915 Urgency of urination: Secondary | ICD-10-CM | POA: Diagnosis not present

## 2017-04-25 LAB — POCT URINALYSIS DIPSTICK
Bilirubin, UA: NEGATIVE
Blood, UA: NEGATIVE
GLUCOSE UA: NEGATIVE
KETONES UA: NEGATIVE
LEUKOCYTES UA: NEGATIVE
Nitrite, UA: NEGATIVE
PROTEIN UA: NEGATIVE
Urobilinogen, UA: 0.2 E.U./dL
pH, UA: 5 (ref 5.0–8.0)

## 2017-04-25 LAB — POCT URINE PREGNANCY: PREG TEST UR: NEGATIVE

## 2017-04-25 NOTE — Progress Notes (Signed)
Patient scheduled for PUS while in office, mother present. Call placed to [place], spoke with [place], was advised [place] machine was down, unable to schedule.  Call to [facility] Radiology scheduling, spoke with [facility]. Patient scheduled for PUS at [facility] on 04/26/17 arriving at 8:45am for 9am appointment. Patient aware to arrive with full bladder, drink 32oz water 1 hour prior. Patient verbalizes understanding and is agreeable.

## 2017-04-25 NOTE — Progress Notes (Signed)
GYNECOLOGY  VISIT   HPI: 21 y.o.   Single  African American  female   G0P0000 with No LMP recorded. Patient has had an injection.   here for   Irregular bleeding -- patient also complains of having discomfort with urination and urgency to urinate since last night. Per patient has taken the Estradiol so that bleeding would stop; bleeding decreased Friday 04/22/17.  Pad change twice a day.  Irregular bleeding for 9 months.   No pelvic pain or discomfort.   Hx irregular bleeding with NuvaRing and with Depo Provera.  Had heavy cycles prior to starting NuvaRing with pad change 2 - 3 times a day.   GC/CT both negative 03/11/17.  Normal TFTs 03/11/17.   No regular use of NSAIDs.   Urine: Negative UPT:  Negative.  Patient's mother is present for the visit today.  GYNECOLOGIC HISTORY: No LMP recorded. Patient has had an injection. Contraception:  Depo-Provera Menopausal hormone therapy:  n/a Last mammogram:  n/a Last pap smear:   n/a        OB History    Gravida Para Term Preterm AB Living   0 0 0 0 0 0   SAB TAB Ectopic Multiple Live Births   0 0 0 0 0         There are no active problems to display for this patient.   No past medical history on file.  No past surgical history on file.  Current Outpatient Prescriptions  Medication Sig Dispense Refill  . estradiol (ESTRACE) 1 MG tablet Take 1 tablet (1 mg total) by mouth daily. 30 tablet 2  . medroxyPROGESTERone (DEPO-PROVERA) 150 MG/ML injection Inject 150 mg into the muscle every 3 (three) months.     No current facility-administered medications for this visit.      ALLERGIES: Patient has no known allergies.  Family History  Problem Relation Age of Onset  . Diabetes Mother   . Hypertension Mother   . Hyperlipidemia Mother   . Hypertension Father   . Diabetes Maternal Grandmother   . Hypertension Maternal Grandmother   . Hypertension Maternal Grandfather     Social History   Social History  . Marital  status: Single    Spouse name: N/A  . Number of children: N/A  . Years of education: N/A   Occupational History  . Not on file.   Social History Main Topics  . Smoking status: Never Smoker  . Smokeless tobacco: Never Used  . Alcohol use No  . Drug use: No  . Sexual activity: Yes    Partners: Male    Birth control/ protection: Injection   Other Topics Concern  . Not on file   Social History Narrative  . No narrative on file    ROS:  Pertinent items are noted in HPI.  PHYSICAL EXAMINATION:    BP 116/70 (BP Location: Right Arm, Patient Position: Sitting, Cuff Size: Normal)   Pulse 84   Temp 98.6 F (37 C) (Oral)   Resp 16   Wt 144 lb (65.3 kg)   BMI 26.34 kg/m     General appearance: alert, cooperative and appears stated age   Pelvic: External genitalia:  no lesions              Urethra:  normal appearing urethra with no masses, tenderness or lesions              Bartholins and Skenes: normal  Vagina: normal appearing vagina with normal color and discharge, no lesions              Cervix: no lesions.  [place] old blood.                Bimanual Exam:  Uterus:  normal size, contour, position, consistency, mobility, non-tender              Adnexa: no mass, fullness, tenderness                Chaperone was present for exam.  ASSESSMENT  Irregular vaginal bleeding with contraceptives.  Urinary urgency.  No UTI.  PLAN  I discussed possible etiologies of her bleeding - hormonal contraception, ovarian cysts, fibroids.  Proceed with pelvic ultrasound. She wants to take a break from the hormonal contraception.  Stop estradiol.   An After Visit Summary was printed and given to the patient.  __15____ minutes face to face time of which over 50% was spent in counseling.

## 2017-04-26 ENCOUNTER — Ambulatory Visit (HOSPITAL_COMMUNITY): Payer: BLUE CROSS/BLUE SHIELD

## 2017-04-29 DIAGNOSIS — N10 Acute tubulo-interstitial nephritis: Secondary | ICD-10-CM | POA: Diagnosis not present

## 2017-05-03 ENCOUNTER — Telehealth: Payer: Self-pay | Admitting: Obstetrics and Gynecology

## 2017-05-03 ENCOUNTER — Other Ambulatory Visit: Payer: Self-pay | Admitting: Obstetrics and Gynecology

## 2017-05-03 DIAGNOSIS — N926 Irregular menstruation, unspecified: Secondary | ICD-10-CM

## 2017-05-03 NOTE — Telephone Encounter (Signed)
Spoke with patient. Patient requesting to increase Estradiol, states 1mg  is not effective for bleeding.  Advised patient Dr. Quincy Simmonds advised not to restart Estradiol, PUS recommended for further evaluation. Patient states she did not go to [facility] for PUS on 10/16 d/t the cost.   Patient states she has continued to take estradiol 1mg  daily, reports bleeding as "heavy" changing pad 2-3 times per day.   [facility] on [facility]/18 and was treated for "UTI that turned into kidney infection". Currently taking Cipro 500 mg bid x7 days. States symptoms are improving, has not received culture results.   Reviewed recommendations per OV dated 10/15, stop estradiol, PUS for further evaluation. Offered to schedule PUS in office for 10/25, patient declined. Patient states she goes to school in [place] and is hard to get back to [place]. RN offered assistance in scheduling PUS in [place], patient states she would like to look into options for imaging that the school may offer and return call.  Advised patient will review with Dr. Quincy Simmonds and return call with any additional recommendations. Advised patient Dr. Quincy Simmonds is out of the office today, response may not be immediate. Patient verbalizes understanding.    Dr. Quincy Simmonds -please review and advise?

## 2017-05-03 NOTE — Telephone Encounter (Signed)
Patient would like to increase the dosage of her estradiol.

## 2017-05-03 NOTE — Telephone Encounter (Signed)
Thank you for the update.  I discontinued her estrogen prescription in EPIC. I am concerned that the patient is self medicating against my advice.  She needs to complete the pelvic ultrasound.  It is possible that the estrogen will be the recommended treatment, but I do not know this at this time.  I am happy to see her for her ultrasound this week.   Cc- Lamont Snowball

## 2017-05-03 NOTE — Telephone Encounter (Signed)
Patient returned call to ask what times are available to PUS in office on 10/25? Advised 9am on 10/25.   Patient scheduled for PUS on 05/05/17 at 9am with consult to follow at 9:30am with Dr. Quincy Simmonds. Advised patient to stop estradiol until further evaluation.   Advised will update Dr. Quincy Simmonds and return call with any additional recommendations. Patient verbalizes understanding.   Order placed for PUS.  Routing to Dr. Quincy Simmonds.  Cc: Lerry Liner

## 2017-05-05 ENCOUNTER — Ambulatory Visit (INDEPENDENT_AMBULATORY_CARE_PROVIDER_SITE_OTHER): Payer: BLUE CROSS/BLUE SHIELD

## 2017-05-05 ENCOUNTER — Ambulatory Visit (INDEPENDENT_AMBULATORY_CARE_PROVIDER_SITE_OTHER): Payer: BLUE CROSS/BLUE SHIELD | Admitting: Obstetrics and Gynecology

## 2017-05-05 ENCOUNTER — Encounter: Payer: Self-pay | Admitting: Obstetrics and Gynecology

## 2017-05-05 VITALS — BP 120/76 | HR 70 | Ht 62.0 in | Wt 144.0 lb

## 2017-05-05 DIAGNOSIS — Z3009 Encounter for other general counseling and advice on contraception: Secondary | ICD-10-CM

## 2017-05-05 DIAGNOSIS — N939 Abnormal uterine and vaginal bleeding, unspecified: Secondary | ICD-10-CM | POA: Diagnosis not present

## 2017-05-05 DIAGNOSIS — N926 Irregular menstruation, unspecified: Secondary | ICD-10-CM | POA: Diagnosis not present

## 2017-05-05 MED ORDER — ESTRADIOL 2 MG PO TABS: 2.0000 mg | ORAL_TABLET | Freq: Every day | ORAL | 0 refills | Status: DC

## 2017-05-05 NOTE — Telephone Encounter (Signed)
Patient evaluated in office on 05/05/17 by Dr. Quincy Simmonds.   Routing to provider for final review. Patient is agreeable to disposition. Will close encounter.  Cc: Lamont Snowball, RN

## 2017-05-05 NOTE — Patient Instructions (Signed)
Please let me know how you are doing in one month!  Consider the [place] IUD.

## 2017-05-05 NOTE — Progress Notes (Signed)
Encounter reviewed by Dr. Brook Amundson C. Silva.  

## 2017-05-05 NOTE — Progress Notes (Signed)
Patient ID: Danielle Phelps, female   DOB: 1995/09/09, 21 y.o.   MRN: [id number] GYNECOLOGY  VISIT   HPI: 21 y.o.   Single  African American  female   G0P0000 with No LMP recorded. Patient has had an injection.   here for pelvic ultrasound for irregular bleeding.    Had abnormal bleeding with NuvaRing and with Depo Provera but not prior to this.  Last Depo Provera was 03/11/17.   She may want to consider an IUD or birth control pill after this Depo shot wears off.   Bleeding today.  Pad change 2 - 3 times per day.   The last time she took estradiol was on 04/27/17 or 04/28/17.  She has been starting and stopping estradiol. She was on estradiol 1 mg but skipped some dosages previously and this caused the bleeding again.   UPT negative on 04/25/17.  GYNECOLOGIC HISTORY: No LMP recorded. Patient has had an injection. Contraception:  Depo Provera injection Menopausal hormone therapy:  n/a Last mammogram:  n/a Last pap smear:  n/a        OB History    Gravida Para Term Preterm AB Living   0 0 0 0 0 0   SAB TAB Ectopic Multiple Live Births   0 0 0 0 0         There are no active problems to display for this patient.   No past medical history on file.  No past surgical history on file.  Current Outpatient Prescriptions  Medication Sig Dispense Refill  . medroxyPROGESTERone (DEPO-PROVERA) 150 MG/ML injection Inject 150 mg into the muscle every 3 (three) months.     No current facility-administered medications for this visit.      ALLERGIES: Patient has no known allergies.  Family History  Problem Relation Age of Onset  . Diabetes Mother   . Hypertension Mother   . Hyperlipidemia Mother   . Hypertension Father   . Diabetes Maternal Grandmother   . Hypertension Maternal Grandmother   . Hypertension Maternal Grandfather     Social History   Social History  . Marital status: Single    Spouse name: N/A  . Number of children: N/A  . Years of education: N/A    Occupational History  . Not on file.   Social History Main Topics  . Smoking status: Never Smoker  . Smokeless tobacco: Never Used  . Alcohol use No  . Drug use: No  . Sexual activity: Yes    Partners: Male    Birth control/ protection: Injection     Comment: Depo Provera injection   Other Topics Concern  . Not on file   Social History Narrative  . No narrative on file    ROS:  Pertinent items are noted in HPI.  PHYSICAL EXAMINATION:    BP 120/76 (BP Location: Right Arm, Patient Position: Sitting, Cuff Size: Normal)   Pulse 70   Ht 5\' 2"  (1.575 m)   Wt 144 lb (65.3 kg)   BMI 26.34 kg/m     General appearance: alert, cooperative and appears stated age  Pelvic ultrasound Uterus no masses.  EMS 2.84.  Possible mass versus clot in the LUS.  Ovaries with follicles.  No free fluid.   ASSESSMENT  Abnormal uterine bleeding.  On Depo Provera.  Considering other contraceptives.  PLAN  We talked about potential etiologies of the bleeding including a polyp of the endometrium.  She will take estradiol 2 mg daily for one month only.  If bleeding persists, she will return for a saline ultrasound.  We discussed dilation and curettage, but I am not recommending this at this juncture.  We reviewed [place] IUD and brochure given.    An After Visit Summary was printed and given to the patient.  __25____ minutes face to face time of which over 50% was spent in counseling.

## 2017-05-31 ENCOUNTER — Telehealth: Payer: Self-pay | Admitting: Obstetrics and Gynecology

## 2017-05-31 NOTE — Telephone Encounter (Signed)
Is she still having irregular bleeding? If so, the plan is for her to have a sonohysterogram.  If she is not having irregular bleeding, she can continue on the Estrace 1 mg for 2 more weeks.

## 2017-05-31 NOTE — Telephone Encounter (Signed)
Spoke with patient. Patient states that her last Depo injection was given in August. Asking when the last day is that she would be due to have another injection. Does not wish to have another injection. Wants to know for contraception coverage. Last Depo was given 03/11/2017. Next injection due 11/16-12/14. Patient verbalizes understanding. States she has been taking Estrace 1 mg daily due to irregular bleeding with Depo. Has a few tablets left. Asking for a refill so she does not run out before her Depo runs out. Advised will review with Dr.Silva and return call. Patient is agreeable.

## 2017-05-31 NOTE — Telephone Encounter (Signed)
Patient has some questions for a nurse about her birth control.

## 2017-06-01 MED ORDER — ESTRADIOL 2 MG PO TABS: 2.0000 mg | ORAL_TABLET | Freq: Every day | ORAL | 0 refills | Status: DC

## 2017-06-01 NOTE — Telephone Encounter (Signed)
Patient is no longer having irregular bleeding since taking Estradiol. Per review patient has been taking Estradiol 2 mg daily. Was previously on 1 mg but dose was increased at her last OV.  Routing to Dr.Silva for review of dosing before sending in medication.

## 2017-06-01 NOTE — Telephone Encounter (Signed)
[place] for the Estrace 2 mg daily for 2 more weeks and then stop.  Thank you for the correction.

## 2017-06-01 NOTE — Telephone Encounter (Signed)
Rx for Estradiol 2 mg sent to pharmacy on file. Encounter closed.

## 2017-06-23 ENCOUNTER — Telehealth: Payer: Self-pay | Admitting: Obstetrics and Gynecology

## 2017-06-23 NOTE — Telephone Encounter (Signed)
Spoke with patient, advised as seen below per Dr. Sabra Heck. Patient states she would "like to take a break from contraceptives for at least a month". Patient aware to return call to office when she decides on contraceptive or with any additional questions/concerns. Patient verbalizes understanding and is agreeable.   Routing to provider for final review. Patient is agreeable to disposition. Will close encounter.

## 2017-06-23 NOTE — Telephone Encounter (Signed)
Patient is asking to talk with a nurse about her "bleeding".

## 2017-06-23 NOTE — Telephone Encounter (Signed)
Spoke with patient. Patient states she is home from school, left estradiol RX at school, requesting refill. Patient reports taking last dose on 12/12, started bleeding 2 days ago. States this is the first bleeding she has had since seeing Dr. Quincy Simmonds in October.  Changing 2 pads per day.   Does not wish to continue depo-provera inj.   Per review of Epic, telephone encounter dated 05/31/17 - Estrace 2mg  daily for 2 more weeks then stop. OV dated 05/05/17 -[facility] if bleeding persist.   Advised patient Dr. Quincy Simmonds is on LOA, will review with covering provider and return call with recommendations. Patient is agreeable.   Dr. Sabra Heck -please review and advise?

## 2017-06-23 NOTE — Telephone Encounter (Signed)
On November 20th, Dr. Quincy Simmonds advised to take the Estradiol 2mg  for two more weeks and then stop.  It's fine that she's stopped the estradiol.  She is bleeding because she stopped it and that appropriate.  Last Depo injection was 03/11/17.  Does not have contraception now.  Appropriate to switch to different contraception method at this time.  Has she made a decision about what she would like?  Ok to proceed with IUD or Nuva ring or OCP.  Depo Provera not giving contraception after 3 months from injection.

## 2017-06-29 ENCOUNTER — Telehealth: Payer: Self-pay | Admitting: Obstetrics and Gynecology

## 2017-06-29 NOTE — Telephone Encounter (Signed)
Spoke with patient. Reports cycle has continued since 06/22/17 -see previous telephone encounter dated 06/23/17.   Reports flow has increased to changing tampon 3 times per day. Patient unsure of size of tampon. Denies any other gyn symptoms.   Advised of recommendations per Dr. Sabra Heck on 06/23/17. Patient request OCP.   Advised patient will review with Dr. Sabra Heck on 12/20 and return call with recommendations, patient is agreeable.   Advised patient should bleeding become heavy -changing saturated pad/tampon q1-2 hours or severe pain develops, seek care at [facility]. Patient verbalizes understanding and is agreeable.   Dr. Sabra Heck -please advise on OCP?

## 2017-06-29 NOTE — Telephone Encounter (Signed)
Patient is "still having problems with irregular bleeding". Patient was told to call if this happened again.

## 2017-06-30 NOTE — Telephone Encounter (Signed)
I would recommend starting Loestrin 1/20 daily.  Ok to call into pharmacy for pt.  Recheck 2 months with Dr. Quincy Simmonds.

## 2017-07-01 MED ORDER — NORETHINDRONE ACET-ETHINYL EST 1-20 MG-MCG PO TABS
1.0000 | ORAL_TABLET | Freq: Every day | ORAL | 0 refills | Status: DC
Start: 2017-07-01 — End: 2017-09-01

## 2017-07-01 NOTE — Telephone Encounter (Signed)
Left detailed message, ok per current dpr. Advised RX for Loestrin 1/20 1 tab daily po #3/0RF [facility] [address]. Return call to office to schedule 2 month f/u with Dr. Quincy Simmonds.   Routing to provider for final review.  Will close encounter.

## 2017-08-29 ENCOUNTER — Telehealth: Payer: Self-pay | Admitting: Obstetrics and Gynecology

## 2017-08-29 ENCOUNTER — Encounter: Payer: Self-pay | Admitting: Obstetrics and Gynecology

## 2017-08-29 ENCOUNTER — Other Ambulatory Visit: Payer: Self-pay | Admitting: Obstetrics and Gynecology

## 2017-08-29 ENCOUNTER — Telehealth: Payer: Self-pay

## 2017-08-29 NOTE — Telephone Encounter (Signed)
Spoke with patient. Patient reports irregular menses and thick brown d/c between cycles. Describes d/c as old blood.   LMP 2/14 to current -changes tampon 3x/day; cycle prior was 2/6-2/11. Menstrual like cramps with cycles.   No contraceptives, did not start recommended OCP. Not currently SA.   Denies pain, N/V, fever/chills.   Recommended OV for further evaluation. Patient states she is away for college, can return  2/21 in the afternoon or 2/22 for OV. Advised will review schedule with Dr. Quincy Simmonds and return call, patient is agreeable.   Dr. Quincy Simmonds - please advise on scheduling?

## 2017-08-29 NOTE — Telephone Encounter (Signed)
Spoke with patient, OV scheduled for 2/21 at 1:15pm with Dr. Quincy Simmonds. Patient verbalizes understanding and is agreeable.   Routing to provider for final review. Patient is agreeable to disposition. Will close encounter.

## 2017-08-29 NOTE — Telephone Encounter (Signed)
Patient is asking to talk with a nurse regarding her bleeding.

## 2017-08-29 NOTE — Telephone Encounter (Signed)
Please have her try to come a little earlier, like 1:00 or 1:15 the same day instead of later.

## 2017-08-29 NOTE — Telephone Encounter (Signed)
Patient needs to be seen for an office visit.  No refill of estrogen at this time.

## 2017-08-29 NOTE — Telephone Encounter (Signed)
Patient sent a refill request for Estradiol 2 mg daily.  Patient is scheduled for OV on 09/01/2017 per triage phone call from earlier today.  Judithe Modest D  to Nunzio Cobbs, MD     08/29/17 1:58 PM  Hello Dr. Julien Nordmann. Update.  I have been getting two periods each moth.  In January, i was had bleeding from Jan 13-19 and had on and off brown discharge for the remaining month.  I was intimate on Jan 20th but im not sure if that effected it.  In February i had bleeding from Feb 6-11 and again from Feb 15 which is on going as of today the 18th.  Before the bleeding i was experiencing the thick brown discharge.  I change my tampons about three times a day when there is bleeding and wear a pantyliner/ pad every day because of the discharge.  I have attached a picture of the discharge on a tampon.  I requested a refill the estradiol because i will be going on a 8 day vacation starting March 9th and did not want the nuisance of having of wearing a tampon at the beach.

## 2017-09-01 ENCOUNTER — Ambulatory Visit: Payer: BLUE CROSS/BLUE SHIELD | Admitting: Obstetrics and Gynecology

## 2017-09-01 ENCOUNTER — Encounter: Payer: Self-pay | Admitting: Obstetrics and Gynecology

## 2017-09-01 ENCOUNTER — Other Ambulatory Visit: Payer: Self-pay

## 2017-09-01 VITALS — BP 110/70 | HR 74 | Resp 14 | Wt 135.0 lb

## 2017-09-01 DIAGNOSIS — N939 Abnormal uterine and vaginal bleeding, unspecified: Secondary | ICD-10-CM

## 2017-09-01 DIAGNOSIS — N926 Irregular menstruation, unspecified: Secondary | ICD-10-CM

## 2017-09-01 LAB — POCT URINE PREGNANCY: Preg Test, Ur: NEGATIVE

## 2017-09-01 NOTE — Progress Notes (Signed)
Scheduled patient while in office for [facility] on 09/15/2017 at 8:30 am with 9 am consult with Dr.Silva. Patientis agreeable to date and time.

## 2017-09-01 NOTE — Progress Notes (Signed)
GYNECOLOGY  VISIT   HPI: 22 y.o.   Single  African American  female   [place] with Patient's last menstrual period was 08/26/2017.   here for irregular menses   Bleeding twice a month and brown spotting in between.  With her cycles, she is changing a tampon 3 times a day.  Bleeding most of the time.  No pain.  Some headache.  No lightheadedness.   No bleeding with brushing her teeth. No excessive bleeding with any cuts.   Last Depo Provera 03/11/17.  Had pelvic US showing thickening at the LUS, possible endometrial mass versus clot.   Last intercourse 08/23/17. No new partner.  Declines hormonal birth control.    Had irregular bleeding with NuvaRing and Depo Provera. Had some improvement with bleeding from Depo when took oral estrogen. Using condoms.   Neg GC/CT 02/11/17. Normal TFTs 02/11/17.   UPT today - negative.   GYNECOLOGIC HISTORY: Patient's last menstrual period was 08/26/2017. Contraception:  none Last pap smear:   n/a        OB History    Gravida Para Term Preterm AB Living   0 0 0 0 0 0   SAB TAB Ectopic Multiple Live Births   0 0 0 0 0         There are no active problems to display for this patient.   History reviewed. No pertinent past medical history.  History reviewed. No pertinent surgical history.  No current outpatient medications on file.   No current facility-administered medications for this visit.      ALLERGIES: Patient has no known allergies.  Family History  Problem Relation Age of Onset  . Diabetes Mother   . Hypertension Mother   . Hyperlipidemia Mother   . Hypertension Father   . Diabetes Maternal Grandmother   . Hypertension Maternal Grandmother   . Hypertension Maternal Grandfather     Social History   Socioeconomic History  . Marital status: Single    Spouse name: Not on file  . Number of children: Not on file  . Years of education: Not on file  . Highest education level: Not on file  Social Needs  . Financial  resource strain: Not on file  . Food insecurity - worry: Not on file  . Food insecurity - inability: Not on file  . Transportation needs - medical: Not on file  . Transportation needs - non-medical: Not on file  Occupational History  . Not on file  Tobacco Use  . Smoking status: Never Smoker  . Smokeless tobacco: Never Used  Substance and Sexual Activity  . Alcohol use: No  . Drug use: No  . Sexual activity: Not Currently    Partners: Male    Birth control/protection: None  Other Topics Concern  . Not on file  Social History Narrative  . Not on file    ROS:  Pertinent items are noted in HPI.  PHYSICAL EXAMINATION:    BP 110/70 (BP Location: Right Arm, Patient Position: Sitting, Cuff Size: Normal)   Pulse 74   Resp 14   Wt 135 lb (61.2 kg)   LMP 08/26/2017   BMI 24.69 kg/m     General appearance: alert, cooperative and appears stated age  Pelvic: External genitalia:  no lesions              Urethra:  normal appearing urethra with no masses, tenderness or lesions  Bartholins and Skenes: normal                 Vagina: normal appearing vagina with normal color and discharge, no lesions              Cervix: no lesions. Dark red blood noted.  No CMT.                Bimanual Exam:  Uterus:  normal size, contour, position, consistency, mobility, non-tender              Adnexa: no mass, fullness, tenderness                 Chaperone was present for exam.  ASSESSMENT  Abnormal uterine bleeding.  Possible endometrial mass versus clot in LUS on prior ultrasound.   PLAN  Check CBC.  Return for sonohysterogram.  Rational explained.  No Rx until evaluation is completed.   An After Visit Summary was printed and given to the patient.  ___15___ minutes face to face time of which over 50% was spent in counseling.

## 2017-09-02 LAB — CBC
HEMOGLOBIN: 13 g/dL (ref 11.1–15.9)
Hematocrit: 39.2 % (ref 34.0–46.6)
MCH: 28 pg (ref 26.6–33.0)
MCHC: 33.2 g/dL (ref 31.5–35.7)
MCV: 85 fL (ref 79–97)
PLATELETS: 170 10*3/uL (ref 150–379)
RBC: 4.64 x10E6/uL (ref 3.77–5.28)
RDW: 14.4 % (ref 12.3–15.4)
WBC: 4.7 10*3/uL (ref 3.4–10.8)

## 2017-09-04 ENCOUNTER — Telehealth: Payer: Self-pay | Admitting: Obstetrics and Gynecology

## 2017-09-04 DIAGNOSIS — N939 Abnormal uterine and vaginal bleeding, unspecified: Secondary | ICD-10-CM | POA: Insufficient documentation

## 2017-09-04 NOTE — Telephone Encounter (Signed)
Please contact patient and have her abstain from intercourse until her ultrasound visit has been completed.

## 2017-09-05 ENCOUNTER — Telehealth: Payer: Self-pay | Admitting: Obstetrics and Gynecology

## 2017-09-05 MED ORDER — FLUCONAZOLE 150 MG PO TABS: ORAL_TABLET | ORAL | 0 refills | Status: DC

## 2017-09-05 NOTE — Telephone Encounter (Signed)
Spoke with patient. Patient requesting RX for yeast.  Reports symptoms of thick, white, vaginal d/c, no odor and mild itching started on 09/04/17.   Patient states she is in [place] for school, declines OV. Recommended OTC monistat, return call to office if symptoms do not resolve. Patient states she can not use the OTC monistat, causes burning, request RX.   Advised will review with Dr. Quincy Simmonds and return call with any additional recommendations, patient is agreeable.  Last OV 09/01/17 [facility]acy [facility]   Dr. Quincy Simmonds -please advise on RX for yeast?

## 2017-09-05 NOTE — Telephone Encounter (Signed)
Ok for Diflucan 150 mg po x 1.  May repeat x 1 in 72 hours if needed.  Disp - 2 RF - none.  If symptoms persist, needs office visit.

## 2017-09-05 NOTE — Telephone Encounter (Signed)
Patient would like to discuss current medication. Last seen 09/01/17.

## 2017-09-05 NOTE — Telephone Encounter (Signed)
Spoke with patient, advised as seen below per Dr. Quincy Simmonds. Patient verbalizes understanding and is agreeable. Will close encounter.

## 2017-09-05 NOTE — Telephone Encounter (Signed)
Spoke with patient, advised as seen below per Dr. Quincy Simmonds. Scheduled for [facility] on 09/15/17. Patient verbalizes understanding and is agreeable.   Routing to provider for final review. Patient is agreeable to disposition. Will close encounter.

## 2017-09-12 ENCOUNTER — Telehealth: Payer: Self-pay | Admitting: Obstetrics and Gynecology

## 2017-09-12 NOTE — Telephone Encounter (Signed)
Patient cancelled ultrasound appointment for Thursday. She can't come this week and will call back to reschedule.

## 2017-09-12 NOTE — Telephone Encounter (Signed)
Call placed to patient to reschedule ultrasound appointment. Left voicemail message requesting a return call.

## 2017-09-15 ENCOUNTER — Other Ambulatory Visit: Payer: BLUE CROSS/BLUE SHIELD

## 2017-09-15 ENCOUNTER — Other Ambulatory Visit: Payer: BLUE CROSS/BLUE SHIELD | Admitting: Obstetrics and Gynecology

## 2017-09-20 NOTE — Telephone Encounter (Signed)
Second call placed to patient to follow up and reschedule cancelled ultrasound appointment

## 2017-09-29 NOTE — Telephone Encounter (Signed)
Third call placed to patient to follow up and reschedule appointment. Patient answered, but stated she will need to call back.

## 2017-09-29 NOTE — Telephone Encounter (Signed)
Patient returned call. Spoke with patient about rescheduling the recommended sonohysterogram. Patient advised the irregular bleeding has stopped and states she does not wish to reschedule since she is no longer having symptoms. Advised patient I will forward this information to her provider to review.  Forwarding to Dr Quincy Simmonds

## 2017-09-29 NOTE — Telephone Encounter (Signed)
I have reviewed the messages.  She does not need the sonohystereogram if she is not having abnormal bleeding.  Encounter closed.

## 2018-01-03 ENCOUNTER — Telehealth: Payer: Self-pay | Admitting: Obstetrics and Gynecology

## 2018-01-03 ENCOUNTER — Other Ambulatory Visit: Payer: Self-pay | Admitting: Obstetrics & Gynecology

## 2018-01-03 NOTE — Telephone Encounter (Signed)
Patient requesting a refill on diflucan.

## 2018-01-03 NOTE — Telephone Encounter (Signed)
I would recommend OV as well.  Will route to Dr. Quincy Simmonds in case desires something else.

## 2018-01-03 NOTE — Telephone Encounter (Signed)
Spoke with patient. Patient reports vaginal itching with white, creamy, vaginal d/c. No odor, pain or bleeding.  Symptoms started 2 days ago. Patient states she can not use OTC monistat, causing burning, requesting RX for diflucan.   Advised patient RX sent for diflucan on 09/05/17.  OV recommended for further evaluation. Patient declined OV, requesting to review with provider. Advised patient Dr. Quincy Simmonds is out of the office, will review with covering provider and return call. Patient agreeable. Pharmacy updated.   Last AEX: 02/02/17 Next AEX: Not scheduled   Dr. Sabra Heck -please review and advise on diflucan RX.  Cc: Dr. Quincy Simmonds

## 2018-01-04 NOTE — Telephone Encounter (Signed)
Spoke with patient, advised as seen below per Dr. Quincy Simmonds. Patient declined to schedule OV at this time, will return call at later date.  Routing to provider for final review. Encounter closed.

## 2018-01-04 NOTE — Telephone Encounter (Signed)
I recommend office visit with me.  

## 2018-01-10 ENCOUNTER — Telehealth: Payer: Self-pay | Admitting: Obstetrics and Gynecology

## 2018-01-10 NOTE — Telephone Encounter (Signed)
Patient would like to be fit in to be seen tomorrow. She is experiencing some discharge.

## 2018-01-10 NOTE — Telephone Encounter (Signed)
Spoke with patient. Reports thick, white vaginal d/c, no odor, since 6/23, requesting OV. Denies pain, irregular bleeding, N/V, fever/chills.   OV scheduled for 01/18/18 at 1pm. Patient declined OV for 7/5 or 7/9.   Routing to provider for final review. Patient is agreeable to disposition. Will close encounter.

## 2018-01-17 ENCOUNTER — Telehealth: Payer: Self-pay | Admitting: Obstetrics and Gynecology

## 2018-01-17 NOTE — Telephone Encounter (Signed)
Thank you for the update.  Encounter closed. 

## 2018-01-17 NOTE — Telephone Encounter (Signed)
Patient cancelled problem visit for Wednesday. She is feeling better and will call to schedule if need to.

## 2018-01-18 ENCOUNTER — Ambulatory Visit: Payer: Self-pay | Admitting: Obstetrics and Gynecology

## 2018-06-19 ENCOUNTER — Ambulatory Visit: Payer: BLUE CROSS/BLUE SHIELD | Admitting: Obstetrics and Gynecology

## 2018-06-19 ENCOUNTER — Telehealth: Payer: Self-pay | Admitting: Obstetrics and Gynecology

## 2018-06-19 ENCOUNTER — Encounter: Payer: Self-pay | Admitting: Obstetrics and Gynecology

## 2018-06-19 NOTE — Telephone Encounter (Signed)
Patient canceled her aex today and rescheduled to tomorrow. She stated that she forgot the appointment and would not be able to come in today. [facility] policy followed.

## 2018-06-19 NOTE — Progress Notes (Deleted)
21 y.o. G0P0000 Single African American female here for annual exam.    PCP:     No LMP recorded.           Sexually active: {yes no:314532}  The current method of family planning is {contraception:315051}.    Exercising: {yes no:314532}  {types:19826} Smoker:  no  Health Maintenance: Pap: never  History of abnormal Pap:  n/a MMG:  n/a Colonoscopy:  n/a BMD:   n/a  Result  n/a TDaP:  2015 Gardasil: #1 02-02-17  HIV: 02-02-17 NR Hep C: 02-02-17 Neg Screening Labs:  Hb today: ***, Urine today: ***   reports that she has never smoked. She has never used smokeless tobacco. She reports that she does not drink alcohol or use drugs.  No past medical history on file.  No past surgical history on file.  Current Outpatient Medications  Medication Sig Dispense Refill  . fluconazole (DIFLUCAN) 150 MG tablet Take one tablet now po, may repeat in 72 hrs if symptoms still present. 2 tablet 0   No current facility-administered medications for this visit.     Family History  Problem Relation Age of Onset  . Diabetes Mother   . Hypertension Mother   . Hyperlipidemia Mother   . Hypertension Father   . Diabetes Maternal Grandmother   . Hypertension Maternal Grandmother   . Hypertension Maternal Grandfather     Review of Systems  Exam:   There were no vitals taken for this visit.    General appearance: alert, cooperative and appears stated age Head: Normocephalic, without obvious abnormality, atraumatic Neck: no adenopathy, supple, symmetrical, trachea midline and thyroid normal to inspection and palpation Lungs: clear to auscultation bilaterally Breasts: normal appearance, no masses or tenderness, No nipple retraction or dimpling, No nipple discharge or bleeding, No axillary or supraclavicular adenopathy Heart: regular rate and rhythm Abdomen: soft, non-tender; no masses, no organomegaly Extremities: extremities normal, atraumatic, no cyanosis or edema Skin: Skin color, texture,  turgor normal. No rashes or lesions Lymph nodes: Cervical, supraclavicular, and axillary nodes normal. No abnormal inguinal nodes palpated Neurologic: Grossly normal  Pelvic: External genitalia:  no lesions              Urethra:  normal appearing urethra with no masses, tenderness or lesions              Bartholins and Skenes: normal                 Vagina: normal appearing vagina with normal color and discharge, no lesions              Cervix: no lesions              Pap taken: {yes no:314532} Bimanual Exam:  Uterus:  normal size, contour, position, consistency, mobility, non-tender              Adnexa: no mass, fullness, tenderness              Rectal exam: {yes no:314532}.  Confirms.              Anus:  normal sphincter tone, no lesions  Chaperone was present for exam.  Assessment:   Well woman visit with normal exam.   Plan: Mammogram screening. Recommended self breast awareness. Pap and HR HPV as above. Guidelines for Calcium, Vitamin D, regular exercise program including cardiovascular and weight bearing exercise.   Follow up annually and prn.   Additional counseling given.  {yes [id number]. _______ minutes face to  face time of which over 50% was spent in counseling.    After visit summary provided.

## 2018-06-19 NOTE — Telephone Encounter (Signed)
Thank you for the update.  Ok to close encounter. 

## 2018-06-20 ENCOUNTER — Ambulatory Visit: Payer: BLUE CROSS/BLUE SHIELD | Admitting: Obstetrics and Gynecology

## 2018-06-20 ENCOUNTER — Encounter: Payer: Self-pay | Admitting: Obstetrics and Gynecology

## 2018-06-20 ENCOUNTER — Other Ambulatory Visit: Payer: Self-pay

## 2018-06-20 ENCOUNTER — Other Ambulatory Visit (HOSPITAL_COMMUNITY)
Admission: RE | Admit: 2018-06-20 | Discharge: 2018-06-20 | Disposition: A | Discharge status: Home / Self Care | Payer: BLUE CROSS/BLUE SHIELD | Source: Ambulatory Visit | Attending: Obstetrics and Gynecology | Admitting: Obstetrics and Gynecology

## 2018-06-20 VITALS — BP 110/60 | HR 80 | Resp 16 | Ht 61.75 in | Wt 126.8 lb

## 2018-06-20 DIAGNOSIS — Z113 Encounter for screening for infections with a predominantly sexual mode of transmission: Secondary | ICD-10-CM | POA: Insufficient documentation

## 2018-06-20 DIAGNOSIS — Z01419 Encounter for gynecological examination (general) (routine) without abnormal findings: Secondary | ICD-10-CM | POA: Diagnosis not present

## 2018-06-20 DIAGNOSIS — Z23 Encounter for immunization: Secondary | ICD-10-CM | POA: Diagnosis not present

## 2018-06-20 NOTE — Patient Instructions (Signed)

## 2018-06-20 NOTE — Progress Notes (Addendum)
22 y.o. G0P0000 Single African American female here for annual exam.    Menses are back to normal.  Last 4 - 5 days.  Heavy for first 2 days.  No bleeding in btw cycles.  Usual period cramps.  Declines birth control.  Desires STD screening.   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  PCP:  No PCP  Patient's last menstrual period was 06/16/2018.           Sexually active: Yes.    The current method of family planning is none.    Exercising: No.   Smoker:  no  Health Maintenance: Pap:  Never History of abnormal Pap:  no TDaP:  2015 Gardasil:   Had 1 HIV: 02/02/17 Neg  Hep C: 02/02/17 Neg  Screening Labs:     reports that she has never smoked. She has never used smokeless tobacco. She reports that she does not drink alcohol or use drugs.  History reviewed. No pertinent past medical history.  History reviewed. No pertinent surgical history.  No current outpatient medications on file.   No current facility-administered medications for this visit.     Family History  Problem Relation Age of Onset  . Diabetes Mother   . Hypertension Mother   . Hyperlipidemia Mother   . Hypertension Father   . Diabetes Maternal Grandmother   . Hypertension Maternal Grandmother   . Hypertension Maternal Grandfather     Review of Systems  All other systems reviewed and are negative.   Exam:   BP 110/60 (BP Location: Right Arm, Patient Position: Sitting, Cuff Size: Large)   Pulse 80   Resp 16   Ht 5' 1.75" (1.568 m)   Wt 126 lb 12.8 oz (57.5 kg)   LMP 06/16/2018   BMI 23.38 kg/m     General appearance: alert, cooperative and appears stated age Head: Normocephalic, without obvious abnormality, atraumatic Neck: no adenopathy, supple, symmetrical, trachea midline and thyroid normal to inspection and palpation Lungs: clear to auscultation bilaterally Breasts: normal appearance, no masses or tenderness, No nipple retraction or dimpling, No nipple discharge or bleeding, No  axillary or supraclavicular adenopathy Heart: regular rate and rhythm Abdomen: soft, non-tender; no masses, no organomegaly Extremities: extremities normal, atraumatic, no cyanosis or edema Skin: Skin color, texture, turgor normal. No rashes or lesions Lymph nodes: Cervical, supraclavicular, and axillary nodes normal. No abnormal inguinal nodes palpated Neurologic: Grossly normal  Pelvic: External genitalia:  no lesions              Urethra:  normal appearing urethra with no masses, tenderness or lesions              Bartholins and Skenes: normal                 Vagina: normal appearing vagina with normal color and discharge, no lesions.  Old brown menstrual flow noted.              Cervix: no lesions              Pap taken: Yes.   Bimanual Exam:  Uterus:  normal size, contour, position, consistency, mobility, non-tender              Adnexa: no mass, fullness, tenderness              Chaperone was present for exam.  Assessment:   Well woman visit with normal exam.   Plan: Mammogram screening. Recommended self breast awareness. Pap and HR HPV  as above. Guidelines for Calcium, Vitamin D, regular exercise program including cardiovascular and weight bearing exercise. STD screening.  Gardasil vaccine #2. Routine labs. Flu vaccine recommended.  We discussed emergency contraception and condoms. Follow up annually and prn.   After visit summary provided.

## 2018-06-21 LAB — COMPREHENSIVE METABOLIC PANEL
A/G RATIO: 1.9 (ref 1.2–2.2)
ALT: 13 IU/L (ref 0–32)
AST: 15 IU/L (ref 0–40)
Albumin: 4.8 g/dL (ref 3.5–5.5)
Alkaline Phosphatase: 59 IU/L (ref 39–117)
BILIRUBIN TOTAL: 0.6 mg/dL (ref 0.0–1.2)
BUN / CREAT RATIO: 14 (ref 9–23)
BUN: 10 mg/dL (ref 6–20)
CALCIUM: 9.3 mg/dL (ref 8.7–10.2)
CHLORIDE: 103 mmol/L (ref 96–106)
CO2: 23 mmol/L (ref 20–29)
CREATININE: 0.73 mg/dL (ref 0.57–1.00)
GFR calc Af Amer: 135 mL/min/{1.73_m2} (ref >59)
GFR calc non Af Amer: 117 mL/min/{1.73_m2} (ref >59)
GLUCOSE: 92 mg/dL (ref 65–99)
Globulin, Total: 2.5 g/dL (ref 1.5–4.5)
POTASSIUM: 4 mmol/L (ref 3.5–5.2)
SODIUM: 141 mmol/L (ref 134–144)
TOTAL PROTEIN: 7.3 g/dL (ref 6.0–8.5)

## 2018-06-21 LAB — HEPATITIS C ANTIBODY: Hep C Virus Ab: <0.1 s/co ratio (ref 0.0–0.9)

## 2018-06-21 LAB — LIPID PANEL
CHOLESTEROL TOTAL: 181 mg/dL (ref 100–199)
Chol/HDL Ratio: 3 ratio (ref 0.0–4.4)
HDL: 61 mg/dL (ref >39)
LDL CALC: 111 mg/dL — AB (ref 0–99)
Triglycerides: 47 mg/dL (ref 0–149)
VLDL CHOLESTEROL CAL: 9 mg/dL (ref 5–40)

## 2018-06-21 LAB — HEP, RPR, HIV PANEL
HIV Screen 4th Generation wRfx: NONREACTIVE
Hepatitis B Surface Ag: NEGATIVE
RPR Ser Ql: NONREACTIVE

## 2018-06-21 LAB — CBC
Hematocrit: 37.5 % (ref 34.0–46.6)
Hemoglobin: 12.3 g/dL (ref 11.1–15.9)
MCH: 28 pg (ref 26.6–33.0)
MCHC: 32.8 g/dL (ref 31.5–35.7)
MCV: 85 fL (ref 79–97)
Platelets: 154 10*3/uL (ref 150–450)
RBC: 4.4 x10E6/uL (ref 3.77–5.28)
RDW: 14.3 % (ref 12.3–15.4)
WBC: 3.6 10*3/uL (ref 3.4–10.8)

## 2018-06-22 LAB — CYTOLOGY - PAP
Chlamydia: NEGATIVE
DIAGNOSIS: NEGATIVE
Neisseria Gonorrhea: NEGATIVE
Trichomonas: NEGATIVE

## 2018-07-07 ENCOUNTER — Telehealth: Payer: Self-pay | Admitting: Obstetrics and Gynecology

## 2018-07-07 NOTE — Telephone Encounter (Signed)
Patient inquiring about form that was dropped off yesterday 07/06/18 verifying that she had an annual exam in our office. Patient aware Dr. Quincy Simmonds has the form.

## 2018-07-10 NOTE — Telephone Encounter (Signed)
Form completed and placed at front office for pick up. Patient notified. Copy of form to scan.   Encounter closed.

## 2018-07-10 NOTE — Telephone Encounter (Signed)
Form to Dr. Quincy Simmonds for review and signature.   Last AEX 06/20/18

## 2018-07-12 DIAGNOSIS — D696 Thrombocytopenia, unspecified: Secondary | ICD-10-CM

## 2018-07-12 HISTORY — DX: Thrombocytopenia, unspecified: D69.6

## 2018-07-31 ENCOUNTER — Telehealth: Payer: Self-pay

## 2018-07-31 ENCOUNTER — Telehealth: Payer: Self-pay | Admitting: Obstetrics and Gynecology

## 2018-07-31 ENCOUNTER — Ambulatory Visit (INDEPENDENT_AMBULATORY_CARE_PROVIDER_SITE_OTHER): Payer: BLUE CROSS/BLUE SHIELD

## 2018-07-31 VITALS — BP 102/58 | HR 88 | Temp 98.6°F | Resp 14 | Ht 61.75 in | Wt 127.0 lb

## 2018-07-31 DIAGNOSIS — R350 Frequency of micturition: Secondary | ICD-10-CM

## 2018-07-31 LAB — POCT URINALYSIS DIPSTICK
Bilirubin, UA: NEGATIVE
Glucose, UA: NEGATIVE
KETONES UA: 1
Nitrite, UA: POSITIVE
Protein, UA: POSITIVE — AB
Urobilinogen, UA: 0.2 E.U./dL
pH, UA: 5 (ref 5.0–8.0)

## 2018-07-31 MED ORDER — SULFAMETHOXAZOLE-TRIMETHOPRIM 800-160 MG PO TABS: 1.0000 | ORAL_TABLET | Freq: Two times a day (BID) | ORAL | 0 refills | Status: DC

## 2018-07-31 NOTE — Telephone Encounter (Signed)
Spoke with patient and notified of Dr.Silva's recommendations regarding urine testing. Will send in Rx for Bactrim DS #6, 1 po bid, 0R. She will need office visit if not better in 72 hours.

## 2018-07-31 NOTE — Progress Notes (Signed)
Patient treated for UTI wit Bactrim DS po bid x 3 days.

## 2018-07-31 NOTE — Telephone Encounter (Addendum)
Spoke with patient and she is complaining of urinary frequency and dysuria which began yesterday. Denies any fever. She bought [employer] Urinary Tract Relief late yesterday and it has given her some relief. Advised she needs to have urine evaluated. Advised patient to come in at 11:00am to have urine checked. We will discuss with Dr.Silva when she is in office this afternoon and call her with recommendations. Confirmed patient is not allergic to any medications and the only medication she is taking is the OTC urinary relief medication.Also confirmed pharmacy in chart. She denies any vaginal irritation or new sexual partner.

## 2018-07-31 NOTE — Telephone Encounter (Signed)
-----   Message from Nunzio Cobbs, MD sent at 07/31/2018  2:42 PM EST ----- Please contact patient with results of testing.  Her urine looks suspicious for UTI.  I recommend Bactrim DS po bid x 3 days.  We are sending a culture and will report back when it is ready.  Sometimes, there is a change in abx after the final report returns.  Hydrate well.  She can take AZO standard 100 mg by mouth three times a day for urinary burning/pain.  She can do this for 2 days.  If she is not better in 72 hours, she needs an office visit.   I am highlighting this result so you know to contact the patient.

## 2018-07-31 NOTE — Progress Notes (Signed)
Patient in office with complaints of abdominal cramps that started Saturday, 07/29/18, then urinary frequency, urgency, pain with urination, body aches, some blood with urination, chills that started Sunday 07/30/18. Patient has given a clean catch urine sample that has been resulted via urine dipstick.   Urine test results are as follows: 3+ (large) RBC, +1 Ketones, positive Protein, positive Nitrites, and 2+ (moderate) WBC with a pH of 5.0.  Urine has been drawn up and sent to the lab for urine micro and urine culture.  No new sexual partner per patient and no new vaginal symptoms (discharge and irritation).   Routing to provider and will close encounter.

## 2018-07-31 NOTE — Telephone Encounter (Signed)
Patient asking to talk with a nurse, she believes that she may have a uti.

## 2018-08-01 LAB — URINALYSIS, MICROSCOPIC ONLY
Casts: NONE SEEN /lpf
RBC, UA: >30 /hpf — AB (ref 0–2)
WBC, UA: >30 /hpf — AB (ref 0–5)

## 2018-08-03 LAB — URINE CULTURE

## 2018-10-23 ENCOUNTER — Ambulatory Visit: Payer: BLUE CROSS/BLUE SHIELD

## 2019-01-13 ENCOUNTER — Emergency Department (HOSPITAL_COMMUNITY)
Admission: EM | Admit: 2019-01-13 | Discharge: 2019-01-13 | Disposition: A | Discharge status: Home / Self Care | Payer: BC Managed Care – PPO | Attending: Emergency Medicine | Admitting: Emergency Medicine

## 2019-01-13 ENCOUNTER — Encounter (HOSPITAL_COMMUNITY): Payer: Self-pay

## 2019-01-13 ENCOUNTER — Other Ambulatory Visit: Payer: Self-pay

## 2019-01-13 ENCOUNTER — Emergency Department (HOSPITAL_COMMUNITY): Payer: BC Managed Care – PPO

## 2019-01-13 DIAGNOSIS — S7002XA Contusion of left hip, initial encounter: Secondary | ICD-10-CM | POA: Insufficient documentation

## 2019-01-13 DIAGNOSIS — Y92410 Unspecified street and highway as the place of occurrence of the external cause: Secondary | ICD-10-CM | POA: Diagnosis not present

## 2019-01-13 DIAGNOSIS — Y999 Unspecified external cause status: Secondary | ICD-10-CM | POA: Insufficient documentation

## 2019-01-13 DIAGNOSIS — S79912A Unspecified injury of left hip, initial encounter: Secondary | ICD-10-CM | POA: Diagnosis not present

## 2019-01-13 DIAGNOSIS — S3991XA Unspecified injury of abdomen, initial encounter: Secondary | ICD-10-CM | POA: Diagnosis not present

## 2019-01-13 DIAGNOSIS — Y9389 Activity, other specified: Secondary | ICD-10-CM | POA: Diagnosis not present

## 2019-01-13 DIAGNOSIS — R000 Tachycardia, unspecified: Secondary | ICD-10-CM | POA: Diagnosis not present

## 2019-01-13 DIAGNOSIS — R52 Pain, unspecified: Secondary | ICD-10-CM | POA: Diagnosis not present

## 2019-01-13 LAB — CBC WITH DIFFERENTIAL/PLATELET
Abs Immature Granulocytes: 0.02 10*3/uL (ref 0.00–0.07)
Basophils Absolute: 0 10*3/uL (ref 0.0–0.1)
Basophils Relative: 1 %
Eosinophils Absolute: 0 10*3/uL (ref 0.0–0.5)
Eosinophils Relative: 0 %
HCT: 38.7 % (ref 36.0–46.0)
Hemoglobin: 13 g/dL (ref 12.0–15.0)
Immature Granulocytes: 0 %
Lymphocytes Relative: 15 %
Lymphs Abs: 1.3 10*3/uL (ref 0.7–4.0)
MCH: 29.1 pg (ref 26.0–34.0)
MCHC: 33.6 g/dL (ref 30.0–36.0)
MCV: 86.6 fL (ref 80.0–100.0)
Monocytes Absolute: 0.5 10*3/uL (ref 0.1–1.0)
Monocytes Relative: 6 %
Neutro Abs: 6.4 10*3/uL (ref 1.7–7.7)
Neutrophils Relative %: 78 %
Platelets: 163 10*3/uL (ref 150–400)
RBC: 4.47 MIL/uL (ref 3.87–5.11)
RDW: 13.9 % (ref 11.5–15.5)
WBC: 8.3 10*3/uL (ref 4.0–10.5)
nRBC: 0 % (ref 0.0–0.2)

## 2019-01-13 LAB — URINALYSIS, ROUTINE W REFLEX MICROSCOPIC
Bilirubin Urine: NEGATIVE
Glucose, UA: NEGATIVE mg/dL
Ketones, ur: NEGATIVE mg/dL
Nitrite: NEGATIVE
Protein, ur: NEGATIVE mg/dL
Specific Gravity, Urine: 1.013 (ref 1.005–1.030)
pH: 6 (ref 5.0–8.0)

## 2019-01-13 LAB — BASIC METABOLIC PANEL
Anion gap: 10 (ref 5–15)
BUN: 12 mg/dL (ref 6–20)
CO2: 23 mmol/L (ref 22–32)
Calcium: 8.9 mg/dL (ref 8.9–10.3)
Chloride: 107 mmol/L (ref 98–111)
Creatinine, Ser: 0.65 mg/dL (ref 0.44–1.00)
GFR calc Af Amer: >60 mL/min (ref >60)
GFR calc non Af Amer: >60 mL/min (ref >60)
Glucose, Bld: 100 mg/dL — ABNORMAL HIGH (ref 70–99)
Potassium: 3.3 mmol/L — ABNORMAL LOW (ref 3.5–5.1)
Sodium: 140 mmol/L (ref 135–145)

## 2019-01-13 LAB — I-STAT BETA HCG BLOOD, ED (MC, WL, AP ONLY): I-stat hCG, quantitative: <5 m[IU]/mL (ref <5)

## 2019-01-13 MED ORDER — IOHEXOL 300 MG/ML  SOLN
100.0000 mL | Freq: Once | INTRAMUSCULAR | Status: AC | PRN
  Administered 2019-01-13: 14:00:00 100 mL via INTRAVENOUS

## 2019-01-13 MED ORDER — CYCLOBENZAPRINE HCL 10 MG PO TABS: 10.0000 mg | ORAL_TABLET | Freq: Two times a day (BID) | ORAL | 0 refills | Status: DC | PRN

## 2019-01-13 MED ORDER — SODIUM CHLORIDE (PF) 0.9 % IJ SOLN
INTRAMUSCULAR | Status: AC
  Filled 2019-01-13: qty 50

## 2019-01-13 MED ORDER — OXYCODONE-ACETAMINOPHEN 5-325 MG PO TABS
1.0000 | ORAL_TABLET | Freq: Once | ORAL | Status: AC
  Administered 2019-01-13: 1 via ORAL
  Filled 2019-01-13: qty 1
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
## 2019-01-13 NOTE — ED Notes (Signed)
Pt able to ambulate to the restroom with a stand by assist.

## 2019-01-13 NOTE — Discharge Instructions (Addendum)
Please read instructions below. Apply ice to your areas of pain for 20 minutes at a time. You can take 600 mg of Advil/ibuprofen every 6 hours as needed for pain. You can take flexeril every 12 hours as needed for muscle spasm. Be aware this medication can make you drowsy. Schedule an appointment with your primary care provider to follow up on your visit today. Return to the ER for severely worsening headache, vision changes, if new numbness or tingling in your arms or legs, inability to urinate, inability to hold your bowels, or weakness in your extremities.
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
## 2019-02-06 ENCOUNTER — Other Ambulatory Visit: Payer: Self-pay

## 2019-02-07 ENCOUNTER — Ambulatory Visit (INDEPENDENT_AMBULATORY_CARE_PROVIDER_SITE_OTHER): Payer: BC Managed Care – PPO

## 2019-02-07 VITALS — BP 116/64 | HR 64 | Temp 97.0°F | Resp 14 | Ht 61.0 in | Wt 128.8 lb

## 2019-02-07 DIAGNOSIS — Z23 Encounter for immunization: Secondary | ICD-10-CM | POA: Diagnosis not present

## 2019-02-07 NOTE — Progress Notes (Signed)
Patient in today for 3rd Gardasil injection.   Contraception: none LMP: 01/24/19 Last AEX: 06/20/18 with BS  Injection given in Right deltoid . Patient tolerated shot well.   Patient informed she is finished with the series.  Advised patient, if not on birth control, to return for next injection with cycle.   Routed to provider for final review.  Encounter closed.

## 2019-02-08 ENCOUNTER — Encounter: Payer: Self-pay | Admitting: Obstetrics and Gynecology

## 2019-02-08 ENCOUNTER — Other Ambulatory Visit: Payer: Self-pay

## 2019-02-08 ENCOUNTER — Ambulatory Visit: Payer: BC Managed Care – PPO | Admitting: Obstetrics and Gynecology

## 2019-02-08 VITALS — BP 110/70 | HR 84 | Temp 97.6°F | Resp 12 | Ht 61.75 in | Wt 130.6 lb

## 2019-02-08 DIAGNOSIS — N760 Acute vaginitis: Secondary | ICD-10-CM | POA: Diagnosis not present

## 2019-02-08 DIAGNOSIS — N926 Irregular menstruation, unspecified: Secondary | ICD-10-CM

## 2019-02-08 DIAGNOSIS — Z113 Encounter for screening for infections with a predominantly sexual mode of transmission: Secondary | ICD-10-CM | POA: Diagnosis not present

## 2019-02-08 LAB — POCT URINE PREGNANCY: Preg Test, Ur: NEGATIVE

## 2019-02-08 NOTE — Patient Instructions (Signed)
Drospirenone; Ethinyl Estradiol tablets What is this medicine? DROSPIRENONE; ETHINYL ESTRADIOL (dro SPY re nown; ETH in il es tra DYE ole) is an oral contraceptive (birth control pill). This medicine combines two types of female hormones, an estrogen and a progestin. It is used to prevent ovulation and pregnancy. This medicine may be used for other purposes; ask your health care provider or pharmacist if you have questions. COMMON BRAND NAME(S): Standley Dakins, Lo-Zumandimine, Steele Sizer 28-Day, Ocella, Syeda, Vestura, Lauretta Grill, Zumandimine What should I tell my health care provider before I take this medicine? They need to know if you have or ever had any of these conditions:  abnormal vaginal bleeding  adrenal gland disease  blood vessel disease or blood clots  breast, cervical, endometrial, ovarian, liver, or uterine cancer  diabetes  gallbladder disease  heart disease or recent heart attack  high blood pressure  high cholesterol  high potassium level  kidney disease  liver disease  migraine headaches  stroke  systemic lupus erythematosus (SLE)  tobacco smoker  an unusual or allergic reaction to estrogens, progestins, or other medicines, foods, dyes, or preservatives  pregnant or trying to get pregnant  breast-feeding How should I use this medicine? Take this medicine by mouth. To reduce nausea, this medicine may be taken with food. Follow the directions on the prescription label. Take this medicine at the same time each day and in the order directed on the package. Do not take your medicine more often than directed. A patient package insert for the product will be given with each prescription and refill. Read this sheet carefully each time. The sheet may change frequently. Talk to your pediatrician regarding the use of this medicine in children. Special care may be needed. This medicine has been used in female children who have started having  menstrual periods. Overdosage: If you think you have taken too much of this medicine contact a poison control center or emergency room at once. NOTE: This medicine is only for you. Do not share this medicine with others. What if I miss a dose? If you miss a dose, refer to the patient information sheet you received with your medicine for direction. If you miss more than one pill, this medicine may not be as effective and you may need to use another form of birth control. What may interact with this medicine? Do not take this medicine with any of the following medications:  aminoglutethimide  amprenavir, fosamprenavir  atazanavir; cobicistat  anastrozole  bosentan  exemestane  letrozole  metyrapone  testolactone This medicine may also interact with the following medications:  acetaminophen  antiviral medicines for HIV or AIDS  aprepitant  barbiturates  certain antibiotics like rifampin, rifabutin, rifapentine, and possibly penicillins or tetracyclines  certain diuretics like amiloride, spironolactone, triamterene  certain medicines for fungal infections like griseofulvin, ketoconazole, itraconazole  certain medications for high blood pressure or heart conditions like ACE-inhibitors, Angiotensin-II receptor blockers, eplerenone  certain medicines for seizures like carbamazepine, oxcarbazepine, phenobarbital, phenytoin  cholestyramine  cobicistat  corticosteroid like hydrocortisone and prednisolone  cyclosporine  dantrolene  felbamate  grapefruit juice  heparin  lamotrigine  medicines for diabetes, including pioglitazone  modafinil  NSAIDs  potassium supplements  pyrimethamine  raloxifene  St. John's wort  sulfasalazine  tamoxifen  topiramate  thyroid hormones  warfarin his list may not describe all possible interactions. Give your health care provider a list of all the medicines, herbs, non-prescription drugs, or dietary supplements  you use. Also tell them  if you smoke, drink alcohol, or use illegal drugs. Some items may interact with your medicine. This list may not describe all possible interactions. Give your health care provider a list of all the medicines, herbs, non-prescription drugs, or dietary supplements you use. Also tell them if you smoke, drink alcohol, or use illegal drugs. Some items may interact with your medicine. What should I watch for while using this medicine? Visit your doctor or health care professional for regular checks on your progress. You will need a regular breast and pelvic exam and Pap smear while on this medicine. Use an additional method of contraception during the first cycle that you take these tablets. If you have any reason to think you are pregnant, stop taking this medicine right away and contact your doctor or health care professional. If you are taking this medicine for hormone related problems, it may take several cycles of use to see improvement in your condition. Smoking increases the risk of getting a blood clot or having a stroke while you are taking birth control pills, especially if you are more than 24 years old. You are strongly advised not to smoke. This medicine can make your body retain fluid, making your fingers, hands, or ankles swell. Your blood pressure can go up. Contact your doctor or health care professional if you feel you are retaining fluid. This medicine can make you more sensitive to the sun. Keep out of the sun. If you cannot avoid being in the sun, wear protective clothing and use sunscreen. Do not use sun lamps or tanning beds/booths. If you wear contact lenses and notice visual changes, or if the lenses begin to feel uncomfortable, consult your eye care specialist. In some women, tenderness, swelling, or minor bleeding of the gums may occur. Notify your dentist if this happens. Brushing and flossing your teeth regularly may help limit this. See your dentist  regularly and inform your dentist of the medicines you are taking. If you are going to have elective surgery, you may need to stop taking this medicine before the surgery. Consult your health care professional for advice. This medicine does not protect you against HIV infection (AIDS) or any other sexually transmitted diseases. What side effects may I notice from receiving this medicine? Side effects that you should report to your doctor or health care professional as soon as possible:  allergic reactions like skin rash, itching or hives, swelling of the face, lips, or tongue  breast tissue changes or discharge  changes in vision  chest pain  confusion, trouble speaking or understanding  dark urine  general ill feeling or flu-like symptoms  light-colored stools  nausea, vomiting  pain, swelling, warmth in the leg  right upper belly pain  severe headaches  shortness of breath  sudden numbness or weakness of the face, arm or leg  trouble walking, dizziness, loss of balance or coordination  unusual vaginal bleeding  yellowing of the eyes or skin Side effects that usually do not require medical attention (report to your doctor or health care professional if they continue or are bothersome):  acne  brown spots on the face  change in appetite  change in sexual desire  depressed mood or mood swings  fluid retention and swelling  stomach cramps or bloating  unusually weak or tired  weight gain This list may not describe all possible side effects. Call your doctor for medical advice about side effects. You may report side effects to FDA at 1-800-FDA-1088. Where should I  keep my medicine? Keep out of the reach of children. Store at room temperature between 15 and 30 degrees C (59 and 86 degrees F). Throw away any unused medicine after the expiration date. NOTE: This sheet is a summary. It may not cover all possible information. If you have questions about this  medicine, talk to your doctor, pharmacist, or health care provider.  2020 Elsevier/Gold Standard (2016-03-19 13:52:56)

## 2019-02-08 NOTE — Progress Notes (Signed)
GYNECOLOGY  VISIT   HPI: 23 y.o.   Single  African American  female   [place] with Patient's last menstrual period was 01/24/2019.   here for irregular bleeding, vaginal odor. Patient states that prior to period 01/24/19 she had a white discharge with odor. Per patient had a period from 6/28-7/5 then again from 7/15-7/27. Patient states that she thought she had a yeast infection on 01/16/19 after switching sexual partners.    Had two lengthy periods this month.  The last time this occurred was in 2019.   No pelvic pain other than cramping with cycles.   Used Monistat the beginning of July.   No recent abx.   Considering a birth control pill. Used NuvaRing in the past and had irregular cycles.  She wants benefit of treating heavy cycles.   UPT: negative  GYNECOLOGIC HISTORY: Patient's last menstrual period was 01/24/2019. Contraception:  Condoms sometimes per patient Menopausal hormone therapy:  n/a Last mammogram:  n/a Last pap smear:   06/20/18 Negative        OB History    Gravida  0   Para  0   Term  0   Preterm  0   AB  0   Living  0     SAB  0   TAB  0   Ectopic  0   Multiple  0   Live Births  0              Patient Active Problem List   Diagnosis Date Noted  . Abnormal uterine bleeding 09/04/2017    History reviewed. No pertinent past medical history.  History reviewed. No pertinent surgical history.  No current outpatient medications on file.   No current facility-administered medications for this visit.      ALLERGIES: Patient has no known allergies.  Family History  Problem Relation Age of Onset  . Diabetes Mother   . Hypertension Mother   . Hyperlipidemia Mother   . Hypertension Father   . Diabetes Maternal Grandmother   . Hypertension Maternal Grandmother   . Hypertension Maternal Grandfather     Social History   Socioeconomic History  . Marital status: Single    Spouse name: Not on file  . Number of children: Not on  file  . Years of education: Not on file  . Highest education level: Not on file  Occupational History  . Not on file  Social Needs  . Financial resource strain: Not on file  . Food insecurity    Worry: Not on file    Inability: Not on file  . Transportation needs    Medical: Not on file    Non-medical: Not on file  Tobacco Use  . Smoking status: Never Smoker  . Smokeless tobacco: Never Used  Substance and Sexual Activity  . Alcohol use: No  . Drug use: No  . Sexual activity: Yes    Partners: Male    Birth control/protection: None  Lifestyle  . Physical activity    Days per week: Not on file    Minutes per session: Not on file  . Stress: Not on file  Relationships  . Social Herbalist on phone: Not on file    Gets together: Not on file    Attends religious service: Not on file    Active member of club or organization: Not on file    Attends meetings of clubs or organizations: Not on file    Relationship  status: Not on file  . Intimate partner violence    Fear of current or ex partner: Not on file    Emotionally abused: Not on file    Physically abused: Not on file    Forced sexual activity: Not on file  Other Topics Concern  . Not on file  Social History Narrative  . Not on file    Review of Systems  Constitutional: Negative.   HENT: Negative.   Eyes: Negative.   Respiratory: Negative.   Cardiovascular: Negative.   Gastrointestinal: Negative.   Endocrine: Negative.   Genitourinary: Positive for vaginal bleeding.  Musculoskeletal: Negative.   Skin: Negative.   Allergic/Immunologic: Negative.   Neurological: Negative.   Hematological: Negative.   Psychiatric/Behavioral: Negative.     PHYSICAL EXAMINATION:    BP 110/70 (BP Location: Right Arm, Patient Position: Sitting, Cuff Size: Normal)   Pulse 84   Temp 97.6 F (36.4 C) (Temporal)   Resp 12   Ht 5' 1.75" (1.568 m)   Wt 130 lb 9.6 oz (59.2 kg)   LMP 01/24/2019 Comment: neg preg test  7.4.2020  BMI 24.08 kg/m     General appearance: alert, cooperative and appears stated age   Pelvic: External genitalia:  no lesions              Urethra:  normal appearing urethra with no masses, tenderness or lesions              Bartholins and Skenes: normal                 Vagina: normal appearing vagina with normal color and discharge, no lesions              Cervix: no lesions.  Small amount of brown blood from os.                 Bimanual Exam:  Uterus:  normal size, contour, position, consistency, mobility, non-tender              Adnexa: no mass, fullness, tenderness          Chaperone was present for exam.  ASSESSMENT  Menorrhagia. Irregular menses.  STD screening.  Vaginitis.  PLAN  Yasmin.  Refills until annual exam is due.  Warning signs discussed.  STD screening.  Vaginitis screening.  FU for annual exam.    An After Visit Summary was printed and given to the patient.  __15____ minutes face to face time of which over 50% was spent in counseling.

## 2019-02-09 LAB — HEP, RPR, HIV PANEL
HIV Screen 4th Generation wRfx: NONREACTIVE
Hepatitis B Surface Ag: NEGATIVE
RPR Ser Ql: NONREACTIVE

## 2019-02-09 LAB — HEPATITIS C ANTIBODY: Hep C Virus Ab: 0.7 s/co ratio (ref 0.0–0.9)

## 2019-02-13 ENCOUNTER — Other Ambulatory Visit: Payer: Self-pay | Admitting: Obstetrics & Gynecology

## 2019-02-13 LAB — NUSWAB VAGINITIS PLUS (VG+)
Atopobium vaginae: HIGH Score — AB
BVAB 2: HIGH Score — AB
Candida albicans, NAA: NEGATIVE
Candida glabrata, NAA: NEGATIVE
Chlamydia trachomatis, NAA: NEGATIVE
Megasphaera 1: HIGH Score — AB
Neisseria gonorrhoeae, NAA: NEGATIVE
Trich vag by NAA: NEGATIVE

## 2019-02-13 NOTE — Telephone Encounter (Signed)
Medication refill request: OCP Last AEX:  06-20-18 BS  Next AEX: 06-27-2019 Last MMG (if hormonal medication request): n/a Refill authorized: per aex note, patient to start yasmin- refills not sent from 02-08-2019. Please advise on medication. Received request for Junel.

## 2019-02-14 ENCOUNTER — Other Ambulatory Visit: Payer: Self-pay | Admitting: Obstetrics and Gynecology

## 2019-02-14 ENCOUNTER — Telehealth: Payer: Self-pay | Admitting: *Deleted

## 2019-02-14 MED ORDER — DROSPIRENONE-ETHINYL ESTRADIOL 3-0.03 MG PO TABS: 1.0000 | ORAL_TABLET | Freq: Every day | ORAL | 4 refills | Status: DC

## 2019-02-14 MED ORDER — METRONIDAZOLE 500 MG PO TABS: 500.0000 mg | ORAL_TABLET | Freq: Two times a day (BID) | ORAL | 0 refills | Status: DC

## 2019-02-14 NOTE — Telephone Encounter (Signed)
Message left to return call to Triage Nurse at [phone].    

## 2019-02-14 NOTE — Telephone Encounter (Signed)
-----   Message from Nunzio Cobbs, MD sent at 02/14/2019 12:36 PM EDT ----- Please inform of Affirm result showing bacterial vaginosis. She may treat with Flagyl 500 mg po bid for 7 days or Metrogel pv at hs for 5 nights.  Please send Rx to pharmacy of choice. ETOH precautions.   She tested negative for yeast, trichomonas, gonorrhea, and chlamydia.

## 2019-02-14 NOTE — Telephone Encounter (Signed)
Patient returned call. Results reviewed with patient and she verbalized understanding. Patient requesting oral flagyl. Instructions on use reviewed with patient and she verbalized understanding. ETOH precautions reviewed. Pharmacy confirmed as [facility] [address]. Patient advised to return call if symptoms do not resolve after treatment.   Prescription for flagyl #14, 0RF sent to confirmed pharmacy.   Will close encounter.

## 2019-02-20 ENCOUNTER — Ambulatory Visit: Payer: BLUE CROSS/BLUE SHIELD

## 2019-04-08 ENCOUNTER — Other Ambulatory Visit: Payer: Self-pay | Admitting: Obstetrics & Gynecology

## 2019-04-16 ENCOUNTER — Other Ambulatory Visit: Payer: Self-pay

## 2019-04-16 NOTE — Telephone Encounter (Signed)
Erroneous encounter

## 2019-05-21 DIAGNOSIS — Z1159 Encounter for screening for other viral diseases: Secondary | ICD-10-CM | POA: Diagnosis not present

## 2019-05-21 DIAGNOSIS — R05 Cough: Secondary | ICD-10-CM | POA: Diagnosis not present

## 2019-06-22 ENCOUNTER — Other Ambulatory Visit: Payer: Self-pay

## 2019-06-26 DIAGNOSIS — R05 Cough: Secondary | ICD-10-CM | POA: Diagnosis not present

## 2019-06-26 DIAGNOSIS — Z9189 Other specified personal risk factors, not elsewhere classified: Secondary | ICD-10-CM | POA: Diagnosis not present

## 2019-06-26 NOTE — Progress Notes (Signed)
23 y.o. G0P0000 Single African American female here for annual exam.    Patient never took birth control pills that were prescribed. States her periods are regular and bleeding is normal.  Heavy for the first 2 days.  Not having painful cycles.   She declines STD testing.  She had negative testing done in July and was tx for BV then. No change in partner.   Moved into a new apartment.  She works for a [employer].   Sister had Covid and she recovered.   PCP:  None   Patient's last menstrual period was 05/30/2019 (exact date).     Period Cycle (Days): 30 Period Duration (Days): 5 days Period Pattern: Regular Menstrual Flow: Moderate Menstrual Control: Tampon, Maxi pad Menstrual Control Change Freq (Hours): every 3-4 hours on heaviest day Dysmenorrhea: (!) Moderate Dysmenorrhea Symptoms: Cramping(occ headaches)     Sexually active: No.   Female preference.  The current method of family planning is Abstinence  Exercising: No.  The patient does not participate in regular exercise at present. Smoker:  no  Health Maintenance: Pap: 06-20-18 Neg History of abnormal Pap:  no MMG:  n/a Colonoscopy:  n/a BMD:   n/a  Result  n/a TDaP:  2015 Gardasil:   Yes, completed HIV:02-08-19 NR Hep C:02-08-19 Neg Screening Labs:  Today. Flu vaccine:  Recommended.   reports that she has never smoked. She has never used smokeless tobacco. She reports that she does not drink alcohol or use drugs.  History reviewed. No pertinent past medical history.  History reviewed. No pertinent surgical history.  No current outpatient medications on file.   No current facility-administered medications for this visit.    Family History  Problem Relation Age of Onset  . Diabetes Mother   . Hypertension Mother   . Hyperlipidemia Mother   . Hypertension Father   . Diabetes Maternal Grandmother   . Hypertension Maternal Grandmother   . Hypertension Maternal Grandfather     Review of Systems   All other systems reviewed and are negative.   Exam:   BP 118/72   Pulse 90   Temp (!) 96.9 F (36.1 C) (Temporal)   Resp (!) 24   Ht 5' 1.5" (1.562 m)   Wt 140 lb (63.5 kg)   LMP 05/30/2019 (Exact Date)   BMI 26.02 kg/m     General appearance: alert, cooperative and appears stated age Head: normocephalic, without obvious abnormality, atraumatic Neck: no adenopathy, supple, symmetrical, trachea midline and thyroid normal to inspection and palpation Lungs: clear to auscultation bilaterally Breasts: normal appearance, no masses or tenderness, No nipple retraction or dimpling, No nipple discharge or bleeding, No axillary adenopathy Heart: regular rate and rhythm Abdomen: soft, non-tender; no masses, no organomegaly Extremities: extremities normal, atraumatic, no cyanosis or edema Skin: skin color, texture, turgor normal. No rashes or lesions Lymph nodes: cervical, supraclavicular, and axillary nodes normal. Neurologic: grossly normal  Pelvic: External genitalia:  no lesions              No abnormal inguinal nodes palpated.              Urethra:  normal appearing urethra with no masses, tenderness or lesions              Bartholins and Skenes: normal                 Vagina: normal appearing vagina with normal color and discharge, no lesions  Cervix: no lesions              Pap taken: No. Bimanual Exam:  Uterus:  normal size, contour, position, consistency, mobility, non-tender              Adnexa: no mass, fullness, tenderness            Chaperone was present for exam.  Assessment:   Well woman visit with normal exam.  Plan: Mammogram screening discussed. Self breast awareness reviewed. Pap and HR HPV as above. Guidelines for Calcium, Vitamin D, regular exercise program including cardiovascular and weight bearing exercise. Routine labs.  We discussed emergency contraception.  Wellness form will be signed for patient today.  Follow up annually and prn.    After visit summary provided.

## 2019-06-27 ENCOUNTER — Ambulatory Visit: Payer: BC Managed Care – PPO | Admitting: Obstetrics and Gynecology

## 2019-06-27 ENCOUNTER — Encounter: Payer: Self-pay | Admitting: Obstetrics and Gynecology

## 2019-06-27 ENCOUNTER — Other Ambulatory Visit: Payer: Self-pay

## 2019-06-27 VITALS — BP 118/72 | HR 90 | Temp 96.9°F | Resp 24 | Ht 61.5 in | Wt 140.0 lb

## 2019-06-27 DIAGNOSIS — Z01419 Encounter for gynecological examination (general) (routine) without abnormal findings: Secondary | ICD-10-CM

## 2019-06-27 DIAGNOSIS — D696 Thrombocytopenia, unspecified: Secondary | ICD-10-CM

## 2019-06-27 NOTE — Patient Instructions (Signed)

## 2019-06-28 ENCOUNTER — Telehealth: Payer: Self-pay | Admitting: *Deleted

## 2019-06-28 ENCOUNTER — Encounter: Payer: Self-pay | Admitting: Obstetrics and Gynecology

## 2019-06-28 LAB — COMPREHENSIVE METABOLIC PANEL
ALT: 11 IU/L (ref 0–32)
AST: 15 IU/L (ref 0–40)
Albumin/Globulin Ratio: 1.7 (ref 1.2–2.2)
Albumin: 4.4 g/dL (ref 3.9–5.0)
Alkaline Phosphatase: 55 IU/L (ref 39–117)
BUN/Creatinine Ratio: 14 (ref 9–23)
BUN: 10 mg/dL (ref 6–20)
Bilirubin Total: 0.5 mg/dL (ref 0.0–1.2)
CO2: 23 mmol/L (ref 20–29)
Calcium: 8.9 mg/dL (ref 8.7–10.2)
Chloride: 106 mmol/L (ref 96–106)
Creatinine, Ser: 0.71 mg/dL (ref 0.57–1.00)
GFR calc Af Amer: 139 mL/min/{1.73_m2} (ref >59)
GFR calc non Af Amer: 120 mL/min/{1.73_m2} (ref >59)
Globulin, Total: 2.6 g/dL (ref 1.5–4.5)
Glucose: 87 mg/dL (ref 65–99)
Potassium: 3.9 mmol/L (ref 3.5–5.2)
Sodium: 140 mmol/L (ref 134–144)
Total Protein: 7 g/dL (ref 6.0–8.5)

## 2019-06-28 LAB — CBC
Hematocrit: 39.4 % (ref 34.0–46.6)
Hemoglobin: 12.9 g/dL (ref 11.1–15.9)
MCH: 29.9 pg (ref 26.6–33.0)
MCHC: 32.7 g/dL (ref 31.5–35.7)
MCV: 91 fL (ref 79–97)
Platelets: 116 10*3/uL — ABNORMAL LOW (ref 150–450)
RBC: 4.32 x10E6/uL (ref 3.77–5.28)
RDW: 13.4 % (ref 11.7–15.4)
WBC: 5.2 10*3/uL (ref 3.4–10.8)

## 2019-06-28 LAB — LIPID PANEL
Chol/HDL Ratio: 2.9 ratio (ref 0.0–4.4)
Cholesterol, Total: 174 mg/dL (ref 100–199)
HDL: 61 mg/dL (ref >39)
LDL Chol Calc (NIH): 101 mg/dL — ABNORMAL HIGH (ref 0–99)
Triglycerides: 65 mg/dL (ref 0–149)
VLDL Cholesterol Cal: 12 mg/dL (ref 5–40)

## 2019-06-28 NOTE — Telephone Encounter (Signed)
Burnice Logan, RN  06/28/2019 11:43 AM EST    Left message to call [name], RN at [facility].

## 2019-06-28 NOTE — Telephone Encounter (Signed)
Patient returning call to Jill. °

## 2019-06-28 NOTE — Telephone Encounter (Signed)
Spoke with patient, advised of results as seen below per Dr. Quincy Simmonds. Lab appt scheduled for 07/25/19 at 9am.   Patient verbalizes understanding and is agreeable.    Encounter closed.

## 2019-06-28 NOTE — Telephone Encounter (Signed)
-----   Message from Nunzio Cobbs, MD sent at 06/28/2019 10:31 AM EST ----- Please contact patient in follow up to her blood work.  Her platelets were low at 116,000. This is a number that fluctuates and will need to be rechecked to see if she is developing a trend.  Her blood chemistries and cholesterol all looked good.   Please make an appointment for a lab visit in 4 weeks to recheck her CBC.

## 2019-06-28 NOTE — Addendum Note (Signed)
Addended by: Yisroel Ramming, Dietrich Pates E on: 06/28/2019 10:31 AM   Modules accepted: Orders

## 2019-07-23 ENCOUNTER — Telehealth: Payer: Self-pay | Admitting: Obstetrics and Gynecology

## 2019-07-23 NOTE — Telephone Encounter (Signed)
Patient canceled her upcoming CBC lab appointment 07/25/19. She did not wish to reschedule at this time. She stated that she would call later to reschedule in the next few weeks.

## 2019-07-24 NOTE — Telephone Encounter (Signed)
Patient is in Epic for follow up labs.  I will receive a note if this is not completed.  I did close this encounter.

## 2019-07-25 ENCOUNTER — Other Ambulatory Visit: Payer: Self-pay

## 2019-08-24 ENCOUNTER — Telehealth: Payer: Self-pay | Admitting: Obstetrics and Gynecology

## 2019-08-24 NOTE — Telephone Encounter (Signed)
Letter pended for repeat labwork.   Routing to Dr Quincy Simmonds for review letter.

## 2019-08-24 NOTE — Telephone Encounter (Signed)
Please send patient a letter regarding my recommendation for her to have a follow up complete blood count.  She has low platelets detected in December, 2020, and she really needs this rechecked to see if she is developing a problem with her blood cells.   She has cancelled her lab recheck appointment.   If she has already done this elsewhere, please have her forward this to our office.   Thank you.

## 2019-08-28 NOTE — Telephone Encounter (Signed)
Letter ok to send

## 2019-08-28 NOTE — Telephone Encounter (Signed)
Letter sent via mychart.   Encounter closed.

## 2019-09-03 ENCOUNTER — Telehealth: Payer: Self-pay | Admitting: Obstetrics and Gynecology

## 2019-09-03 ENCOUNTER — Other Ambulatory Visit: Payer: Self-pay

## 2019-09-03 NOTE — Telephone Encounter (Signed)
Patient scheduled an appointment 09/05/19 for std testing. She is on her cycle and wonders if this will interfere with the results?

## 2019-09-03 NOTE — Telephone Encounter (Signed)
Spoke with patient. Patient is scheduled for OV on 2/24 for STD testing. Menses started on 2/19, asking if ok to proceed with appt as scheduled? Reports vaginal irritation and "soreness" just before start of menses. Partner change 1 month ago. Denies any other symptoms. Advised ok to proceed with OV as scheduled. Questions answered.   After call ended, per review of Epic, patient also needs repeat labs.   Routing to provider for final review. Patient is agreeable to disposition. Will close encounter.

## 2019-09-04 ENCOUNTER — Other Ambulatory Visit: Payer: Self-pay

## 2019-09-05 ENCOUNTER — Other Ambulatory Visit (HOSPITAL_COMMUNITY)
Admission: RE | Admit: 2019-09-05 | Discharge: 2019-09-05 | Disposition: A | Discharge status: Home / Self Care | Payer: BC Managed Care – PPO | Source: Ambulatory Visit | Attending: Obstetrics and Gynecology | Admitting: Obstetrics and Gynecology

## 2019-09-05 ENCOUNTER — Ambulatory Visit: Payer: BC Managed Care – PPO | Admitting: Obstetrics and Gynecology

## 2019-09-05 ENCOUNTER — Encounter: Payer: Self-pay | Admitting: Obstetrics and Gynecology

## 2019-09-05 VITALS — BP 100/64 | HR 84 | Temp 97.0°F | Ht 61.5 in | Wt 136.0 lb

## 2019-09-05 DIAGNOSIS — N760 Acute vaginitis: Secondary | ICD-10-CM

## 2019-09-05 DIAGNOSIS — B9689 Other specified bacterial agents as the cause of diseases classified elsewhere: Secondary | ICD-10-CM | POA: Insufficient documentation

## 2019-09-05 DIAGNOSIS — Z113 Encounter for screening for infections with a predominantly sexual mode of transmission: Secondary | ICD-10-CM | POA: Insufficient documentation

## 2019-09-05 DIAGNOSIS — D696 Thrombocytopenia, unspecified: Secondary | ICD-10-CM

## 2019-09-05 NOTE — Patient Instructions (Signed)

## 2019-09-05 NOTE — Progress Notes (Signed)
GYNECOLOGY  VISIT   HPI: 24 y.o.   Single  African American  female   [place] with Patient's last menstrual period was 08/31/2019 (exact date).   here for STD testing--patient has a new partner. Patient was having some vaginal soreness with wiping. Feeling irritation.  Less soreness now.  No odor.  Cannot tell is she is having discharge.  No pelvic pain.   No new soaps or detergents.   She needs labs today for low platelets also.   GYNECOLOGIC HISTORY: Patient's last menstrual period was 08/31/2019 (exact date). Contraception: condoms Menopausal hormone therapy:  n/a Last mammogram:  n/a Last pap smear:  06-20-18 Neg        OB History    Gravida  0   Para  0   Term  0   Preterm  0   AB  0   Living  0     SAB  0   TAB  0   Ectopic  0   Multiple  0   Live Births  0              Patient Active Problem List   Diagnosis Date Noted  . Abnormal uterine bleeding 09/04/2017    Past Medical History:  Diagnosis Date  . Thrombocytopenia ([place]) 2020    History reviewed. No pertinent surgical history.  No current outpatient medications on file.   No current facility-administered medications for this visit.     ALLERGIES: Patient has no known allergies.  Family History  Problem Relation Age of Onset  . Diabetes Mother   . Hypertension Mother   . Hyperlipidemia Mother   . Hypertension Father   . Diabetes Maternal Grandmother   . Hypertension Maternal Grandmother   . Hypertension Maternal Grandfather     Social History   Socioeconomic History  . Marital status: Single    Spouse name: Not on file  . Number of children: Not on file  . Years of education: Not on file  . Highest education level: Not on file  Occupational History  . Not on file  Tobacco Use  . Smoking status: Never Smoker  . Smokeless tobacco: Never Used  Substance and Sexual Activity  . Alcohol use: No  . Drug use: No  . Sexual activity: Yes    Partners: Male    Birth  control/protection: None, Abstinence  Other Topics Concern  . Not on file  Social History Narrative  . Not on file   Social Determinants of Health   Financial Resource Strain:   . Difficulty of Paying Living Expenses: Not on file  Food Insecurity:   . Worried About Charity fundraiser in the Last Year: Not on file  . Ran Out of Food in the Last Year: Not on file  Transportation Needs:   . Lack of Transportation (Medical): Not on file  . Lack of Transportation (Non-Medical): Not on file  Physical Activity:   . Days of Exercise per Week: Not on file  . Minutes of Exercise per Session: Not on file  Stress:   . Feeling of Stress : Not on file  Social Connections:   . Frequency of Communication with Friends and Family: Not on file  . Frequency of Social Gatherings with Friends and Family: Not on file  . Attends Religious Services: Not on file  . Active Member of Clubs or Organizations: Not on file  . Attends Archivist Meetings: Not on file  . Marital Status: Not  on file  Intimate Partner Violence:   . Fear of Current or Ex-Partner: Not on file  . Emotionally Abused: Not on file  . Physically Abused: Not on file  . Sexually Abused: Not on file    Review of Systems  All other systems reviewed and are negative.   PHYSICAL EXAMINATION:    BP 100/64   Pulse 84   Temp (!) 97 F (36.1 C) (Temporal)   Ht 5' 1.5" (1.562 m)   Wt 136 lb (61.7 kg)   LMP 08/31/2019 (Exact Date)   BMI 25.28 kg/m     General appearance: alert, cooperative and appears stated age   Pelvic: External genitalia:  no lesions              Urethra:  normal appearing urethra with no masses, tenderness or lesions              Bartholins and Skenes: normal                 Vagina: normal appearing vagina with normal color and discharge, no lesions              Cervix: no lesions.  Menstrual flow noted.                 Bimanual Exam:  Uterus:  normal size, contour, position, consistency, mobility,  non-tender              Adnexa: no mass, fullness, tenderness         Chaperone was present for exam.  ASSESSMENT  Vaginitis.  STD screening.  Thrombocytopenia.   PLAN  STD screening.  Vaginitis screening.  Condoms encouraged.  CBC.    An After Visit Summary was printed and given to the patient.  _15____ minutes face to face time of which over 50% was spent in counseling.

## 2019-09-06 LAB — CBC
Hematocrit: 39.6 % (ref 34.0–46.6)
Hemoglobin: 13 g/dL (ref 11.1–15.9)
MCH: 29.1 pg (ref 26.6–33.0)
MCHC: 32.8 g/dL (ref 31.5–35.7)
MCV: 89 fL (ref 79–97)
Platelets: 143 10*3/uL — ABNORMAL LOW (ref 150–450)
RBC: 4.46 x10E6/uL (ref 3.77–5.28)
RDW: 12.9 % (ref 11.7–15.4)
WBC: 4.5 10*3/uL (ref 3.4–10.8)

## 2019-09-06 LAB — HEP, RPR, HIV PANEL
HIV Screen 4th Generation wRfx: NONREACTIVE
Hepatitis B Surface Ag: NEGATIVE
RPR Ser Ql: NONREACTIVE

## 2019-09-06 LAB — CERVICOVAGINAL ANCILLARY ONLY
Bacterial Vaginitis (gardnerella): POSITIVE — AB
Candida Glabrata: NEGATIVE
Candida Vaginitis: NEGATIVE
Chlamydia: NEGATIVE
Comment: NEGATIVE
Comment: NEGATIVE
Comment: NEGATIVE
Comment: NEGATIVE
Comment: NEGATIVE
Comment: NORMAL
Neisseria Gonorrhea: NEGATIVE
Trichomonas: NEGATIVE

## 2019-09-06 LAB — HEPATITIS C ANTIBODY: Hep C Virus Ab: 0.1 s/co ratio (ref 0.0–0.9)

## 2019-09-07 ENCOUNTER — Other Ambulatory Visit: Payer: Self-pay | Admitting: *Deleted

## 2019-09-07 MED ORDER — METRONIDAZOLE 500 MG PO TABS: 500.0000 mg | ORAL_TABLET | Freq: Two times a day (BID) | ORAL | 0 refills | Status: DC

## 2019-09-14 ENCOUNTER — Telehealth: Payer: Self-pay | Admitting: Obstetrics and Gynecology

## 2019-09-14 NOTE — Telephone Encounter (Signed)
Patient has some question for a nurse about her metronidazole medication.

## 2019-09-14 NOTE — Telephone Encounter (Signed)
Spoke to pt. Pt states was here on 09/05/2019 and is being treated with flagyl since 09/07/2019 and will take the last 2 pills tomorrow. Pt reports having increased discharge, more irritation and burning. Pt informed that increased discharge while taking Flagyl was normal. Pt also advised to complete flagyl. Since being the weekend, advised pt to get 3 day monistat to use from Sat- Monday and see if resolves irritation as could be a yeast infection. Pt agreeable. Pt made OV on 09/18/2019 at 8am for recheck d/t irritation. Pt agreeable to return call to office if monistat 3 helps over weekend and then cancel appt.   Routing to Dr Quincy Simmonds for any additional recommendations.

## 2019-09-15 NOTE — Telephone Encounter (Signed)
I agree with follow up if discharge persists.

## 2019-09-17 NOTE — Telephone Encounter (Signed)
Spoke to pt. Pt did not use Monistat 3 over weekend, still having discharge and irritation. Pt to keep appt on 09/18/2019 at 8 am with Dr Quincy Simmonds. CPS neg.   Routing to Dr Quincy Simmonds for final review and will close encounter.

## 2019-09-17 NOTE — Progress Notes (Signed)
GYNECOLOGY  VISIT   HPI: 24 y.o.   Single  African American  female   [place] with Patient's last menstrual period was 08/31/2019 (exact date).   here for vaginal irritation after finishing Flagyl for BV.   She tested negative for GC/CT/trich/yeast on 09/05/19.   Patient began bleeding last PM. Denies pelvic pain.  Intercourse was the day prior and had some irritation afterward.   Had headache last hs.  No flu symptoms.   She has used NuvaRing and Depo Provera in the past for contraception.   GYNECOLOGIC HISTORY: Patient's last menstrual period was 08/31/2019 (exact date). Contraception: Condoms everytime Menopausal hormone therapy:  n/a Last mammogram:  n/a Last pap smear:  06-20-18 Neg        OB History    Gravida  0   Para  0   Term  0   Preterm  0   AB  0   Living  0     SAB  0   TAB  0   Ectopic  0   Multiple  0   Live Births  0              Patient Active Problem List   Diagnosis Date Noted  . Abnormal uterine bleeding 09/04/2017    Past Medical History:  Diagnosis Date  . Thrombocytopenia ([place]) 2020    History reviewed. No pertinent surgical history.  No current outpatient medications on file.   No current facility-administered medications for this visit.     ALLERGIES: Patient has no known allergies.  Family History  Problem Relation Age of Onset  . Diabetes Mother   . Hypertension Mother   . Hyperlipidemia Mother   . Hypertension Father   . Diabetes Maternal Grandmother   . Hypertension Maternal Grandmother   . Hypertension Maternal Grandfather     Social History   Socioeconomic History  . Marital status: Single    Spouse name: Not on file  . Number of children: Not on file  . Years of education: Not on file  . Highest education level: Not on file  Occupational History  . Not on file  Tobacco Use  . Smoking status: Never Smoker  . Smokeless tobacco: Never Used  Substance and Sexual Activity  . Alcohol use: No   . Drug use: No  . Sexual activity: Yes    Partners: Male    Birth control/protection: None, Abstinence  Other Topics Concern  . Not on file  Social History Narrative  . Not on file   Social Determinants of Health   Financial Resource Strain:   . Difficulty of Paying Living Expenses: Not on file  Food Insecurity:   . Worried About Charity fundraiser in the Last Year: Not on file  . Ran Out of Food in the Last Year: Not on file  Transportation Needs:   . Lack of Transportation (Medical): Not on file  . Lack of Transportation (Non-Medical): Not on file  Physical Activity:   . Days of Exercise per Week: Not on file  . Minutes of Exercise per Session: Not on file  Stress:   . Feeling of Stress : Not on file  Social Connections:   . Frequency of Communication with Friends and Family: Not on file  . Frequency of Social Gatherings with Friends and Family: Not on file  . Attends Religious Services: Not on file  . Active Member of Clubs or Organizations: Not on file  . Attends Club or  Organization Meetings: Not on file  . Marital Status: Not on file  Intimate Partner Violence:   . Fear of Current or Ex-Partner: Not on file  . Emotionally Abused: Not on file  . Physically Abused: Not on file  . Sexually Abused: Not on file    Review of Systems  All other systems reviewed and are negative.   PHYSICAL EXAMINATION:    BP 120/78   Pulse 80   Temp (!) 97 F (36.1 C) (Temporal)   Ht 5\' 2"  (1.575 m)   Wt 139 lb (63 kg)   LMP 08/31/2019 (Exact Date)   BMI 25.42 kg/m     General appearance: alert, cooperative and appears stated age   Pelvic: External genitalia:  no lesions              Urethra:  normal appearing urethra with no masses, tenderness or lesions              Bartholins and Skenes: normal                 Vagina: normal appearing vagina with normal color and discharge, no lesions              Cervix: no lesions.  Menstrual flow noted.                 Bimanual  Exam:  Uterus:  normal size, contour, position, consistency, mobility, non-tender              Adnexa: no mass, fullness, tenderness             Chaperone was present for exam.  ASSESSMENT  Vaginal irritation.  Early menstruation.   PLAN  Nuswab for vaginitis.  We discussed birth control options for pregnancy prevention, cycle regulation.  I reviewed IUDs, Earle Gell, Nexplanon, birth control pills.  She will consider her options.  FU prn.    An After Visit Summary was printed and given to the patient.  __20____ minutes face to face time of which over 50% was spent in counseling.

## 2019-09-18 ENCOUNTER — Ambulatory Visit: Payer: BC Managed Care – PPO | Admitting: Obstetrics and Gynecology

## 2019-09-18 ENCOUNTER — Other Ambulatory Visit: Payer: Self-pay

## 2019-09-18 ENCOUNTER — Encounter: Payer: Self-pay | Admitting: Obstetrics and Gynecology

## 2019-09-18 ENCOUNTER — Other Ambulatory Visit (HOSPITAL_COMMUNITY)
Admission: RE | Admit: 2019-09-18 | Discharge: 2019-09-18 | Disposition: A | Discharge status: Home / Self Care | Payer: BC Managed Care – PPO | Source: Ambulatory Visit | Attending: Obstetrics and Gynecology | Admitting: Obstetrics and Gynecology

## 2019-09-18 VITALS — BP 120/78 | HR 80 | Temp 97.0°F | Ht 62.0 in | Wt 139.0 lb

## 2019-09-18 DIAGNOSIS — Z3009 Encounter for other general counseling and advice on contraception: Secondary | ICD-10-CM

## 2019-09-18 DIAGNOSIS — N898 Other specified noninflammatory disorders of vagina: Secondary | ICD-10-CM | POA: Diagnosis not present

## 2019-09-18 NOTE — Patient Instructions (Signed)

## 2019-09-19 LAB — CERVICOVAGINAL ANCILLARY ONLY
Bacterial Vaginitis (gardnerella): NEGATIVE
Candida Glabrata: NEGATIVE
Candida Vaginitis: NEGATIVE
Comment: NEGATIVE
Comment: NEGATIVE
Comment: NEGATIVE
Comment: NEGATIVE
Trichomonas: NEGATIVE

## 2019-09-24 ENCOUNTER — Encounter: Payer: Self-pay | Admitting: Certified Nurse Midwife

## 2019-09-26 ENCOUNTER — Encounter: Payer: Self-pay | Admitting: Certified Nurse Midwife

## 2019-09-27 DIAGNOSIS — R0981 Nasal congestion: Secondary | ICD-10-CM | POA: Diagnosis not present

## 2019-10-02 ENCOUNTER — Telehealth: Payer: Self-pay

## 2019-10-02 NOTE — Telephone Encounter (Signed)
Patient called regarding BV symptoms. Patient wanted advice on a refill for BV medication or an appointment.

## 2019-10-03 ENCOUNTER — Encounter: Payer: Self-pay | Admitting: Obstetrics and Gynecology

## 2019-10-03 ENCOUNTER — Ambulatory Visit: Payer: BC Managed Care – PPO | Admitting: Obstetrics and Gynecology

## 2019-10-03 ENCOUNTER — Other Ambulatory Visit: Payer: Self-pay

## 2019-10-03 VITALS — BP 110/70 | HR 86 | Temp 98.1°F | Ht 62.0 in | Wt 138.4 lb

## 2019-10-03 DIAGNOSIS — N760 Acute vaginitis: Secondary | ICD-10-CM | POA: Diagnosis not present

## 2019-10-03 MED ORDER — NYSTATIN-TRIAMCINOLONE 100000-0.1 UNIT/GM-% EX CREA: 1.0000 "application " | TOPICAL_CREAM | Freq: Two times a day (BID) | CUTANEOUS | 0 refills | Status: DC

## 2019-10-03 NOTE — Progress Notes (Signed)
GYNECOLOGY  VISIT   HPI: 24 y.o.   Single  African American  female   [place] with Patient's last menstrual period was 09/18/2019 (exact date).   here for vaginal irritation and soreness with discharge.  States her bleeding has stopped.   Now she is having irritation and soreness with wiping toilet paper.  Some discomfort randomly from inside of the vagina.  Some discharge.  No odor.   She had BV on 09/05/19 and she was treated with Flagyl. She tested negative for GC/CT/trichomonas then.   Had a follow up visit on 09/18/19, and she tested negative for vaginitis.   Having new exposure to new soaps at her partner's house. She uses regular Dove and Tide Free and Clear at her own house and does not have problems.  She used her partner's deodorant, and she had a skin reaction to it.   GYNECOLOGIC HISTORY: Patient's last menstrual period was 09/18/2019 (exact date). Contraception: condoms everytime Menopausal hormone therapy:  n/a Last mammogram:  n/a Last pap smear:  06-20-18 Neg        OB History    Gravida  0   Para  0   Term  0   Preterm  0   AB  0   Living  0     SAB  0   TAB  0   Ectopic  0   Multiple  0   Live Births  0              Patient Active Problem List   Diagnosis Date Noted  . Abnormal uterine bleeding 09/04/2017    Past Medical History:  Diagnosis Date  . Thrombocytopenia ([place]) 2020    History reviewed. No pertinent surgical history.  No current outpatient medications on file.   No current facility-administered medications for this visit.     ALLERGIES: Patient has no known allergies.  Family History  Problem Relation Age of Onset  . Diabetes Mother   . Hypertension Mother   . Hyperlipidemia Mother   . Hypertension Father   . Diabetes Maternal Grandmother   . Hypertension Maternal Grandmother   . Hypertension Maternal Grandfather     Social History   Socioeconomic History  . Marital status: Single    Spouse name: Not  on file  . Number of children: Not on file  . Years of education: Not on file  . Highest education level: Not on file  Occupational History  . Not on file  Tobacco Use  . Smoking status: Never Smoker  . Smokeless tobacco: Never Used  Substance and Sexual Activity  . Alcohol use: No  . Drug use: No  . Sexual activity: Yes    Partners: Male    Birth control/protection: None, Abstinence  Other Topics Concern  . Not on file  Social History Narrative  . Not on file   Social Determinants of Health   Financial Resource Strain:   . Difficulty of Paying Living Expenses:   Food Insecurity:   . Worried About Charity fundraiser in the Last Year:   . Arboriculturist in the Last Year:   Transportation Needs:   . Film/video editor (Medical):   Marland Kitchen Lack of Transportation (Non-Medical):   Physical Activity:   . Days of Exercise per Week:   . Minutes of Exercise per Session:   Stress:   . Feeling of Stress :   Social Connections:   . Frequency of Communication with Friends and  Family:   . Frequency of Social Gatherings with Friends and Family:   . Attends Religious Services:   . Active Member of Clubs or Organizations:   . Attends Archivist Meetings:   Marland Kitchen Marital Status:   Intimate Partner Violence:   . Fear of Current or Ex-Partner:   . Emotionally Abused:   Marland Kitchen Physically Abused:   . Sexually Abused:     Review of Systems  All other systems reviewed and are negative.   PHYSICAL EXAMINATION:    BP 110/70   Pulse 86   Temp 98.1 F (36.7 C) (Temporal)   Ht 5\' 2"  (1.575 m)   Wt 138 lb 6.4 oz (62.8 kg)   LMP 09/18/2019 (Exact Date)   BMI 25.31 kg/m     General appearance: alert, cooperative and appears stated age  Pelvic: External genitalia:  Mild erythema of the vulva.              Urethra:  normal appearing urethra with no masses, tenderness or lesions              Bartholins and Skenes: normal                 Vagina: normal appearing vagina with normal  color and discharge, no lesions              Cervix: no lesions                Bimanual Exam:  Uterus:  normal size, contour, position, consistency, mobility, non-tender              Adnexa: no mass, fullness, tenderness             Chaperone was present for exam.  ASSESSMENT  Vulvovaginitis.   PLAN  Mycolog II.  Affirm.  She will change her personal products at her partner's house.     An After Visit Summary was printed and given to the patient.  __15____ minutes face to face time of which over 50% was spent in counseling.

## 2019-10-03 NOTE — Telephone Encounter (Signed)
Spoke with patient. Patient reports vaginal irritation and soreness. Denies any other GYN symptoms, hx of BV. Patient is requesting OV as soon as possible, request first available provider. [id number] prescreen negative, precautions reviewed. OV scheduled for today at 4:30pm with Dr. Quincy Simmonds.   Routing to provider for final review. Patient is agreeable to disposition. Will close encounter.

## 2019-10-04 LAB — VAGINITIS/VAGINOSIS, DNA PROBE
Candida Species: NEGATIVE
Gardnerella vaginalis: NEGATIVE
Trichomonas vaginosis: NEGATIVE

## 2019-10-25 DIAGNOSIS — Z20828 Contact with and (suspected) exposure to other viral communicable diseases: Secondary | ICD-10-CM | POA: Diagnosis not present

## 2019-10-25 DIAGNOSIS — Z03818 Encounter for observation for suspected exposure to other biological agents ruled out: Secondary | ICD-10-CM | POA: Diagnosis not present

## 2020-01-10 ENCOUNTER — Other Ambulatory Visit: Payer: Self-pay

## 2020-01-10 ENCOUNTER — Ambulatory Visit (HOSPITAL_COMMUNITY)
Admission: EM | Admit: 2020-01-10 | Discharge: 2020-01-10 | Disposition: A | Discharge status: Home / Self Care | Payer: BC Managed Care – PPO | Attending: Internal Medicine | Admitting: Internal Medicine

## 2020-01-10 ENCOUNTER — Telehealth: Payer: Self-pay | Admitting: Obstetrics and Gynecology

## 2020-01-10 ENCOUNTER — Encounter (HOSPITAL_COMMUNITY): Payer: Self-pay

## 2020-01-10 DIAGNOSIS — N898 Other specified noninflammatory disorders of vagina: Secondary | ICD-10-CM | POA: Insufficient documentation

## 2020-01-10 MED ORDER — METRONIDAZOLE 500 MG PO TABS: 500.0000 mg | ORAL_TABLET | Freq: Three times a day (TID) | ORAL | 0 refills | Status: DC

## 2020-01-10 MED ORDER — METRONIDAZOLE 500 MG PO TABS: 500.0000 mg | ORAL_TABLET | Freq: Two times a day (BID) | ORAL | 0 refills | Status: AC

## 2020-01-10 NOTE — ED Triage Notes (Signed)
Pt c/o fishy odor from vagina, vaginal discomfort and itching since yesterday. Pt states she has BV.

## 2020-01-10 NOTE — ED Provider Notes (Signed)
[facility]    CSN: [id number] Arrival date & time: 01/10/20  1722      History   Chief Complaint Chief Complaint  Patient presents with  . Vaginitis    HPI Danielle Phelps is a 24 y.o. female presents to urgent care today with complaints of vaginal discharge.  Patient reports history of bacterial vaginosis in the past with same symptoms today.  Patient symptoms began approximately 2 days ago with clear discharge with distinct "fishy" odor.  Patient denies any abnormal vaginal bleeding, no abdominal pain or pelvic pain.  Denies recent fever or chills, no abdominal pain, no N/V/D.  LMP 12-21-19.   Past Medical History:  Diagnosis Date  . Thrombocytopenia ([place]) 2020    Patient Active Problem List   Diagnosis Date Noted  . Abnormal uterine bleeding 09/04/2017    History reviewed. No pertinent surgical history.  OB History    Gravida  0   Para  0   Term  0   Preterm  0   AB  0   Living  0     SAB  0   TAB  0   Ectopic  0   Multiple  0   Live Births  0            Home Medications    Prior to Admission medications   Medication Sig Start Date End Date Taking? Authorizing Provider  metroNIDAZOLE (FLAGYL) 500 MG tablet Take 1 tablet (500 mg total) by mouth 2 (two) times daily for 7 days. 01/10/20 01/17/20  Rudolpho Sevin, NP  nystatin-triamcinolone (MYCOLOG II) cream Apply 1 application topically 2 (two) times daily. Apply to affected area BID for up to 7 days. 10/03/19   Nunzio Cobbs, MD    Family History Family History  Problem Relation Age of Onset  . Diabetes Mother   . Hypertension Mother   . Hyperlipidemia Mother   . Hypertension Father   . Diabetes Maternal Grandmother   . Hypertension Maternal Grandmother   . Hypertension Maternal Grandfather     Social History Social History   Tobacco Use  . Smoking status: Never Smoker  . Smokeless tobacco: Never Used  Vaping Use  . Vaping Use: Never used  Substance Use  Topics  . Alcohol use: No  . Drug use: No     Allergies   Patient has no known allergies.   Review of Systems As stated in HPI otherwise negative   Physical Exam Triage Vital Signs ED Triage Vitals  Enc Vitals Group     BP 01/10/20 1733 121/75     Pulse Rate 01/10/20 1733 78     Resp 01/10/20 1733 18     Temp 01/10/20 1733 98.7 F (37.1 C)     Temp Source 01/10/20 1733 Oral     SpO2 01/10/20 1733 100 %     Weight 01/10/20 1735 136 lb (61.7 kg)     Height 01/10/20 1735 5\' 1"  (1.549 m)     Head Circumference --      Peak Flow --      Pain Score 01/10/20 1735 0     Pain Loc --      Pain Edu? --      Excl. in [place]? --    No data found.  Updated Vital Signs BP 121/75   Pulse 78   Temp 98.7 F (37.1 C) (Oral)   Resp 18   Ht 5\' 1"  (1.549 m)  Wt 61.7 kg   SpO2 100%   BMI 25.70 kg/m   Physical Exam Constitutional:      Appearance: Normal appearance.  Skin:    General: Skin is warm and dry.  Neurological:     Mental Status: She is alert.  Psychiatric:        Mood and Affect: Mood normal.        Behavior: Behavior normal.     UC Treatments / Results  Labs (all labs ordered are listed, but only abnormal results are displayed) Labs Reviewed  CERVICOVAGINAL ANCILLARY ONLY    EKG   Radiology No results found.  Procedures Procedures (including critical care time)  Medications Ordered in UC Medications - No data to display  Initial Impression / Assessment and Plan / UC Course  I have reviewed the triage vital signs and the nursing notes.  Pertinent labs & imaging results that were available during my care of the patient were reviewed by me and considered in my medical decision making (see chart for details).  Vaginal discharge -Symptoms same as previous diagnosis of BV.  Will go and treat with Flagyl twice daily x7 days -STI screening sent  Final Clinical Impressions(s) / UC Diagnoses   Final diagnoses:  Vaginal discharge     Discharge  Instructions     Take medication as prescribed. Avoid sex until symptoms resolve. Someone will contact you with your results if anything abnormal.    ED Prescriptions    Medication Sig Dispense Auth. Provider   metroNIDAZOLE (FLAGYL) 500 MG tablet  (Status: Discontinued) Take 1 tablet (500 mg total) by mouth 3 (three) times daily for 7 days. 21 tablet Rudolpho Sevin, NP   metroNIDAZOLE (FLAGYL) 500 MG tablet Take 1 tablet (500 mg total) by mouth 2 (two) times daily for 7 days. 14 tablet Rudolpho Sevin, NP     PDMP not reviewed this encounter.   Rudolpho Sevin, NP 01/10/20 1829

## 2020-01-10 NOTE — Telephone Encounter (Signed)
Patient has a question regarding bv.

## 2020-01-10 NOTE — Telephone Encounter (Signed)
AEX 06/2019 H/O +BV 01/2019 Neg BV 09/2019  Spoke with pt. Pt states having BV sx again of vaginal discharge, odor and irritation. Pt states started x 2 days ago, but also this past weekend used hotel soap and thinks is what triggered symptoms. Pt requesting medication to be called in since pt at work til 5pm today. Pt advised we don't treat over the phone. Pt agreeable. Pt advised to be seen by PCP or Urgent care for sx and treatment. Pt agreeable and verbalized understanding. Pt agreeable to go to [facility] after work today. Pt does not have PCP.   Pt also states didn't use Mycolog II cream that was prescribed at last OV in 09/2019 for vulvovaginitis. Pt wasn't aware of Rx at pharmacy.   Pt advised will update Dr Quincy Simmonds and return call with any additional recommendations and advice. Pt agreeable.   Routing to Dr Quincy Simmonds.  Encounter closed.

## 2020-01-10 NOTE — Discharge Instructions (Addendum)
Take medication as prescribed. Avoid sex until symptoms resolve. Someone will contact you with your results if anything abnormal.

## 2020-01-11 LAB — CERVICOVAGINAL ANCILLARY ONLY
Bacterial Vaginitis (gardnerella): POSITIVE — AB
Candida Glabrata: NEGATIVE
Candida Vaginitis: NEGATIVE
Chlamydia: NEGATIVE
Comment: NEGATIVE
Comment: NEGATIVE
Comment: NEGATIVE
Comment: NEGATIVE
Comment: NEGATIVE
Comment: NORMAL
Neisseria Gonorrhea: NEGATIVE
Trichomonas: NEGATIVE

## 2020-02-19 ENCOUNTER — Telehealth: Payer: Self-pay

## 2020-02-19 NOTE — Telephone Encounter (Signed)
Call to patient multiple times at the number on file. Automated system states "call can not be completed".   MyChart message to patient to return call to office.

## 2020-02-19 NOTE — Telephone Encounter (Signed)
Attempted patients number again, automated system states call can not be completed. No alternative number on file. Confirmed number on dpr.

## 2020-02-19 NOTE — Telephone Encounter (Signed)
Patient is calling in regards to vaginal discomfort.

## 2020-02-20 NOTE — Telephone Encounter (Signed)
Attempted to contact patient at number on file. Automated system states call can not be completed as dialed.

## 2020-02-21 NOTE — Telephone Encounter (Signed)
Patients phone number updated in Epic.   Spoke with patient. Patient states her symptoms have since resolved, declines OV at this time. Patient thankful for f/u.   Encounter closed.

## 2020-04-03 ENCOUNTER — Encounter (HOSPITAL_COMMUNITY): Payer: Self-pay

## 2020-04-03 ENCOUNTER — Other Ambulatory Visit: Payer: Self-pay

## 2020-04-03 ENCOUNTER — Ambulatory Visit (HOSPITAL_COMMUNITY)
Admission: EM | Admit: 2020-04-03 | Discharge: 2020-04-03 | Disposition: A | Discharge status: Home / Self Care | Payer: BC Managed Care – PPO | Attending: Family Medicine | Admitting: Family Medicine

## 2020-04-03 DIAGNOSIS — N761 Subacute and chronic vaginitis: Secondary | ICD-10-CM | POA: Diagnosis not present

## 2020-04-03 MED ORDER — FLUCONAZOLE 150 MG PO TABS: 150.0000 mg | ORAL_TABLET | Freq: Every day | ORAL | 0 refills | Status: DC

## 2020-04-03 NOTE — Discharge Instructions (Addendum)
Treating you for yeast infection [facility] sent for testing.  Follow up as needed for continued or worsening symptoms

## 2020-04-03 NOTE — ED Provider Notes (Signed)
[place]    CSN: [id number] Arrival date & time: 04/03/20  6789      History   Chief Complaint No chief complaint on file.   HPI Danielle Phelps is a 24 y.o. female.   Patient is a 24 year old female presents today for recurrent yeast vaginitis.  This episode started before her menstrual cycle.  She still having some vaginal itching, irritation.  No specific odor to discharge.  Abdominal pain, dysuria, hematuria urinary frequency.  No concern for STDs at this time.Patient's last menstrual period was 03/27/2020.      Past Medical History:  Diagnosis Date  . Thrombocytopenia ([place]) 2020    Patient Active Problem List   Diagnosis Date Noted  . Abnormal uterine bleeding 09/04/2017    History reviewed. No pertinent surgical history.  OB History    Gravida  0   Para  0   Term  0   Preterm  0   AB  0   Living  0     SAB  0   TAB  0   Ectopic  0   Multiple  0   Live Births  0            Home Medications    Prior to Admission medications   Medication Sig Start Date End Date Taking? Authorizing Provider  fluconazole (DIFLUCAN) 150 MG tablet Take 1 tablet (150 mg total) by mouth daily. 04/03/20   Loura Halt A, NP  nystatin-triamcinolone (MYCOLOG II) cream Apply 1 application topically 2 (two) times daily. Apply to affected area BID for up to 7 days. 10/03/19   Nunzio Cobbs, MD    Family History Family History  Problem Relation Age of Onset  . Diabetes Mother   . Hypertension Mother   . Hyperlipidemia Mother   . Hypertension Father   . Diabetes Maternal Grandmother   . Hypertension Maternal Grandmother   . Hypertension Maternal Grandfather     Social History Social History   Tobacco Use  . Smoking status: Never Smoker  . Smokeless tobacco: Never Used  Vaping Use  . Vaping Use: Never used  Substance Use Topics  . Alcohol use: No  . Drug use: No     Allergies   Patient has no known allergies.   Review  of Systems Review of Systems   Physical Exam Triage Vital Signs ED Triage Vitals  Enc Vitals Group     BP 04/03/20 0946 112/71     Pulse Rate 04/03/20 0946 80     Resp 04/03/20 0946 18     Temp 04/03/20 0946 98.1 F (36.7 C)     Temp Source 04/03/20 0946 Oral     SpO2 04/03/20 0946 100 %     Weight --      Height --      Head Circumference --      Peak Flow --      Pain Score 04/03/20 0944 0     Pain Loc --      Pain Edu? --      Excl. in [place]? --    No data found.  Updated Vital Signs BP 112/71 (BP Location: Left Arm)   Pulse 80   Temp 98.1 F (36.7 C) (Oral)   Resp 18   LMP 03/27/2020   SpO2 100%   Visual Acuity Right Eye Distance:   Left Eye Distance:   Bilateral Distance:    Right Eye Near:   Left  Eye Near:    Bilateral Near:     Physical Exam Vitals and nursing note reviewed.  Constitutional:      General: She is not in acute distress.    Appearance: Normal appearance. She is not ill-appearing, toxic-appearing or diaphoretic.  HENT:     Head: Normocephalic.     Nose: Nose normal.  Eyes:     Conjunctiva/sclera: Conjunctivae normal.  Pulmonary:     Effort: Pulmonary effort is normal.  Musculoskeletal:        General: Normal range of motion.     Cervical back: Normal range of motion.  Skin:    General: Skin is warm and dry.     Findings: No rash.  Neurological:     Mental Status: She is alert.  Psychiatric:        Mood and Affect: Mood normal.      UC Treatments / Results  Labs (all labs ordered are listed, but only abnormal results are displayed) Labs Reviewed  CERVICOVAGINAL ANCILLARY ONLY    EKG   Radiology No results found.  Procedures Procedures (including critical care time)  Medications Ordered in UC Medications - No data to display  Initial Impression / Assessment and Plan / UC Course  I have reviewed the triage vital signs and the nursing notes.  Pertinent labs & imaging results that were available during my care of  the patient were reviewed by me and considered in my medical decision making (see chart for details).     Chronic vaginitis Treating with Diflucan Swab sent for testing Follow up as needed for continued or worsening symptoms  Final Clinical Impressions(s) / UC Diagnoses   Final diagnoses:  Chronic vaginitis     Discharge Instructions     Treating you for yeast infection Swan sent for testing.  Follow up as needed for continued or worsening symptoms     ED Prescriptions    Medication Sig Dispense Auth. Provider   fluconazole (DIFLUCAN) 150 MG tablet Take 1 tablet (150 mg total) by mouth daily. 2 tablet Loura Halt A, NP     PDMP not reviewed this encounter.   Loura Halt A, NP 04/03/20 1048

## 2020-04-03 NOTE — ED Triage Notes (Signed)
Pt presents with recurring vaginal discharge and discomfort.

## 2020-04-04 ENCOUNTER — Telehealth (HOSPITAL_COMMUNITY): Payer: Self-pay | Admitting: Emergency Medicine

## 2020-04-04 LAB — CERVICOVAGINAL ANCILLARY ONLY
Bacterial Vaginitis (gardnerella): POSITIVE — AB
Candida Glabrata: NEGATIVE
Candida Vaginitis: POSITIVE — AB
Chlamydia: NEGATIVE
Comment: NEGATIVE
Comment: NEGATIVE
Comment: NEGATIVE
Comment: NEGATIVE
Comment: NEGATIVE
Comment: NORMAL
Neisseria Gonorrhea: NEGATIVE
Trichomonas: NEGATIVE

## 2020-04-04 MED ORDER — METRONIDAZOLE 500 MG PO TABS: 500.0000 mg | ORAL_TABLET | Freq: Two times a day (BID) | ORAL | 0 refills | Status: DC

## 2020-05-01 ENCOUNTER — Telehealth: Payer: Self-pay

## 2020-05-01 NOTE — Progress Notes (Signed)
GYNECOLOGY  VISIT  CC:   Pt said that she has been having issues since the 17th with an odor. She said that it is a fishy smell. She said that she got some Azo and tried it. She said that there is a different odor now that is different. She said that she has a history of getting BV but she has no discharge and no issues with urination.   HPI: 24 y.o. G0P0000 Single Black or [place] American female here for vaginal discharge and odor consistent with BV.  She's been treated at least four times this year for BV.  Reviewed in Epic and each episode was treated with Flagyl 500mg  bid x 7 days.  Pt has been tested for STDs as well as yeast.  Denies any new concerns about STD.  Would like to know other options for treatment or does it need to continue being symptomatic.  Reviewed with pt treatment for recurrent/relapsing BV including switching to different class of medication like clindamycin, adding boric acid suppositories for 30 days or using metrogel twice weekly for 3-6 months.  Pt is on cycle today and feel uncomfortable with vaginal clindamycin cream.  I do not want to prescribe orally due to C diff colitis risk so will use flagyl again if BV present.  Also, will make additional plans if present.  Pt comfortable with using Boric acid suppositories as well.  Denies urinary symptoms, back pain, or pelvic pain.  Denies fever.  GYNECOLOGIC HISTORY: No LMP recorded. Contraception: None  Menopausal hormone therapy: NA   Patient Active Problem List   Diagnosis Date Noted  . Abnormal uterine bleeding 09/04/2017    Past Medical History:  Diagnosis Date  . Thrombocytopenia ([place]) 2020    No past surgical history on file.  MEDS:   Current Outpatient Medications on File Prior to Visit  Medication Sig Dispense Refill  . fluconazole (DIFLUCAN) 150 MG tablet Take 1 tablet (150 mg total) by mouth daily. 2 tablet 0  . metroNIDAZOLE (FLAGYL) 500 MG tablet Take 1 tablet (500 mg total) by mouth 2 (two) times  daily. 14 tablet 0  . nystatin-triamcinolone (MYCOLOG II) cream Apply 1 application topically 2 (two) times daily. Apply to affected area BID for up to 7 days. 60 g 0   No current facility-administered medications on file prior to visit.    ALLERGIES: Patient has no known allergies.  Family History  Problem Relation Age of Onset  . Diabetes Mother   . Hypertension Mother   . Hyperlipidemia Mother   . Hypertension Father   . Diabetes Maternal Grandmother   . Hypertension Maternal Grandmother   . Hypertension Maternal Grandfather     SH:  Single, non smoker  Review of Systems  Genitourinary: Positive for vaginal discharge.  All other systems reviewed and are negative.   PHYSICAL EXAMINATION:    There were no vitals taken for this visit.    General appearance: alert, cooperative and appears stated age Abdomen: soft, non-tender; bowel sounds normal; no masses,  no organomegaly Lymph:  no inguinal LAD noted  Pelvic: External genitalia:  no lesions              Urethra:  normal appearing urethra with no masses, tenderness or lesions              Bartholins and Skenes: normal                 Vagina: normal appearing vagina with normal color and discharge,  no lesions              Cervix: no lesions and blood present due to cycle              Bimanual Exam:  Uterus:  normal size, contour, position, consistency, mobility, non-tender              Adnexa: no mass, fullness, tenderness   Chaperone, Royal Hawthorn, CMA, was present for exam.  Assessment: Vaginal discharge and vaginal odor Probable relapsing/recurrent BV  Plan: Nuswab for yeast and BV ordered today Flagyl 500mg  bid x 7 days to pharmacy. If BV present, will then start boric acid suppositories, 600mg  nightly x 30 days and would then have pt follow up with Dr. Quincy Simmonds.   25 minutes of total time was spent for this patient encounter, including preparation, face-to-face counseling with the patient and coordination of care,  and documentation of the encounter.

## 2020-05-01 NOTE — Telephone Encounter (Signed)
New message    The patient C/o strong odor asking for a call back to discuss with the nurse.

## 2020-05-01 NOTE — Telephone Encounter (Signed)
AEX 06/2019- next 07/02/20 with Dr Quincy Simmonds  H/o chronic vaginitis, seen in July and Sept 2021 at [facility] +BV and + yeast 04/03/20 +BV 01/10/20 Neg 09/2019  Spoke with pt. Pt reports having "fishy" odor that she noticed after having intercourse this past weekend. Pt denies any vaginal discharge, discomfort, abd pain/cramps or irregular bleeding or UTI sx. Pt states having cycles for past 2-3 months every 29-35 days. LMP 9/16, 8/17 and has not started in Oct. Pt denies any chance of pregnancy. Has not taken home UPT.  Only using condoms for contraception. Advised to take home UPT. Pt advised to have OV for further evaluation. Pt agreeable. Pt advised no available appts with Dr Quincy Simmonds. Pt states would like to be seen by any provider. Pt scheduled with Dr Sabra Heck on 10/22 at 430 pm, arriving by 4 pm to be worked -in. Pt aware and agreeable. Thankful for OV.  Encounter closed  Cc: Dr Sabra Heck for Dr Elza Rafter pt for review.

## 2020-05-02 ENCOUNTER — Other Ambulatory Visit: Payer: Self-pay

## 2020-05-02 ENCOUNTER — Ambulatory Visit: Payer: BC Managed Care – PPO | Admitting: Obstetrics & Gynecology

## 2020-05-02 ENCOUNTER — Encounter: Payer: Self-pay | Admitting: Obstetrics & Gynecology

## 2020-05-02 ENCOUNTER — Telehealth: Payer: Self-pay

## 2020-05-02 VITALS — BP 126/76 | HR 77 | Resp 10 | Ht 62.0 in | Wt 133.0 lb

## 2020-05-02 DIAGNOSIS — N760 Acute vaginitis: Secondary | ICD-10-CM

## 2020-05-02 MED ORDER — METRONIDAZOLE 500 MG PO TABS: 500.0000 mg | ORAL_TABLET | Freq: Two times a day (BID) | ORAL | 0 refills | Status: DC

## 2020-05-02 NOTE — Telephone Encounter (Signed)
Pt ok to be seen for OV today as long as she feels comfortable coming due to cycle start. Please keep appt or reschedule.

## 2020-05-02 NOTE — Telephone Encounter (Signed)
Patient is calling regarding appointment for today. Patient stated she started her cycle and would like to know if she can still be seen.

## 2020-05-02 NOTE — Telephone Encounter (Signed)
Spoke with triage nurse Colletta Maryland. Advised patient it is okay to come to office visit today. Patient understanding an will keep appointment.   Encounter closed.

## 2020-05-04 LAB — NUSWAB BV AND CANDIDA, NAA
Atopobium vaginae: HIGH Score — AB
BVAB 2: HIGH Score — AB
Candida albicans, NAA: NEGATIVE
Candida glabrata, NAA: NEGATIVE
Megasphaera 1: HIGH Score — AB

## 2020-05-05 ENCOUNTER — Telehealth: Payer: Self-pay

## 2020-05-05 MED ORDER — NONFORMULARY OR COMPOUNDED ITEM: 0 refills | Status: DC

## 2020-05-05 NOTE — Telephone Encounter (Signed)
-----   Message from Megan Salon, MD sent at 05/05/2020  1:16 PM EDT ----- Please let pt know her vaginal swab showed BV again.  I already sent the presription for flagyl 500 mg bid x 7 days to the pharmacy.  Once this is completed, she should then use boric acid suppositories nightly x 30 days, 600mg , and then follow up with Dr Quincy Simmonds in about 5-6 weeks.    CC:  Royal Hawthorn, CMA

## 2020-05-05 NOTE — Telephone Encounter (Signed)
Spoke with pt. Pt given results and recommendations per Dr Sabra Heck. Pt agreeable and verbalized understanding. Pt states started Flagyl Rx on 10/22 and is thankful for follow up call today and BV suppression treatment.  Pt scheduled for follow up with Dr Quincy Simmonds on 06/11/20. Pt agreeable to date and time.  Next AEX 07/02/20 Routing to Dr Quincy Simmonds for review and update  Encounter closed

## 2020-06-02 ENCOUNTER — Other Ambulatory Visit: Payer: Self-pay

## 2020-06-02 NOTE — Telephone Encounter (Signed)
Patient has been out of town and feels she left her boric acid suppositories at her hotel. She is requesting a refill. She has follow up appt.06-11-20 and AEX 07-02-20. Advised would send to Dr.Silva and she can check with [place] tomorrow after lunch.  Rx for Boric Acid supp. #30, 0R pended

## 2020-06-02 NOTE — Telephone Encounter (Signed)
Patient went out of town and left her boric acid medication. She is asking if a refill can be called in to [facility].

## 2020-06-03 MED ORDER — NONFORMULARY OR COMPOUNDED ITEM: 0 refills | Status: DC
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
## 2020-06-09 ENCOUNTER — Telehealth: Payer: Self-pay

## 2020-06-09 NOTE — Telephone Encounter (Signed)
Patient cancelled office visit for "BV suppression update" due to going out of town. Patient would like to reschedule. No available appointments routing to triage.

## 2020-06-09 NOTE — Telephone Encounter (Signed)
Spoke with pt. Pt states needing to reschedule OV due to not being in town. Pt advised can speak with Dr Quincy Simmonds at [facility] scheduled on 07/02/20. Pt agreeable and verbalized understanding.  Encounter closed

## 2020-06-11 ENCOUNTER — Ambulatory Visit: Payer: Self-pay | Admitting: Obstetrics and Gynecology

## 2020-06-18 ENCOUNTER — Ambulatory Visit: Payer: Self-pay | Admitting: Obstetrics and Gynecology

## 2020-06-30 NOTE — Progress Notes (Signed)
24 y.o. G0P0000 Single African American female here for annual exam.    Seen in October for vaginitis.  Dx with BV.  Treated with Flagyl 500 mg po bid x 7 days and then boric acid 600 mg pv nightly for 30 days.  States no odor or discharge.  States her cycles are heavy but she does not want to take birth control pills.   GC/CT/trich all negative 04/03/20. HIV/RPR/hHep B and C negative 09/05/19. No change in partner and declines STD screening.  Did Covid vaccine and completed in September.  No flu vaccine.   Working in Risk [employer].   PCP:   None  Patient's last menstrual period was 06/14/2020 (exact date).     Period Cycle (Days): 30 Period Duration (Days): 5 Period Pattern: Regular Menstrual Flow: Heavy Menstrual Control: Tampon,Maxi pad Menstrual Control Change Freq (Hours): changes tampon every 6-8 hours on heaviest day Dysmenorrhea: (!) Mild Dysmenorrhea Symptoms: Cramping,Headache     Sexually active: Yes.    The current method of family planning is condoms sometimes.    Exercising: No.  The patient does not participate in regular exercise at present. Smoker:  no  Health Maintenance: Pap: 06-20-18 Neg History of abnormal Pap:  no MMG:  n/a Colonoscopy:  n/a BMD:   n/a  Result  n/a TDaP: 2015 Gardasil:   Yes, completed HIV: 02-08-19 NR Hep C: 02-08-19 Neg Screening Labs:  today   reports that she has never smoked. She has never used smokeless tobacco. She reports that she does not drink alcohol and does not use drugs.  Past Medical History:  Diagnosis Date  . Thrombocytopenia ([place]) 2020    History reviewed. No pertinent surgical history.  No current outpatient medications on file.   No current facility-administered medications for this visit.    Family History  Problem Relation Age of Onset  . Diabetes Mother   . Hypertension Mother   . Hyperlipidemia Mother   . Hypertension Father   . Diabetes Maternal Grandmother   . Hypertension Maternal  Grandmother   . Hypertension Maternal Grandfather     Review of Systems  All other systems reviewed and are negative.   Exam:   BP 110/74   Pulse 92   Ht 5\' 2"  (1.575 m)   Wt 140 lb (63.5 kg)   LMP 06/14/2020 (Exact Date)   SpO2 98%   BMI 25.61 kg/m     General appearance: alert, cooperative and appears stated age Head: normocephalic, without obvious abnormality, atraumatic Neck: no adenopathy, supple, symmetrical, trachea midline and thyroid normal to inspection and palpation Lungs: clear to auscultation bilaterally Breasts: normal appearance, no masses or tenderness, No nipple retraction or dimpling, No nipple discharge or bleeding, No axillary adenopathy Heart: regular rate and rhythm Abdomen: soft, non-tender; no masses, no organomegaly Extremities: extremities normal, atraumatic, no cyanosis or edema Skin: skin color, texture, turgor normal. No rashes or lesions Lymph nodes: cervical, supraclavicular, and axillary nodes normal. Neurologic: grossly normal  Pelvic: External genitalia: two black pigmented nevi, one 3 mm and one 4 mm on left perineal and buttock region.               No abnormal inguinal nodes palpated.              Urethra:  normal appearing urethra with no masses, tenderness or lesions              Bartholins and Skenes: normal  Vagina: normal appearing vagina with normal color and discharge, no lesions              Cervix: no lesions              Pap taken: No. Bimanual Exam:  Uterus:  normal size, contour, position, consistency, mobility, non-tender              Adnexa: no mass, fullness, tenderness          Chaperone was present for exam.  Assessment:   Well woman visit with normal exam. Recurrent BV.  Asymptomatic.  Pigmented nevi.   Plan: Mammogram screening discussed. Self breast awareness reviewed. Pap and HR HPV as above. Guidelines for Calcium, Vitamin D, regular exercise program including cardiovascular and weight bearing  exercise. She declines suppression regimen for BV, but she will call if she has a recurrence of infection.  She may then consider Metrogel x 4 - 6 months following acute treatment.  Routine labs.  Return for vulvar biopsies.  Rational explained to detect potential abnormal cells.  Follow up annually and prn.

## 2020-07-02 ENCOUNTER — Other Ambulatory Visit: Payer: Self-pay

## 2020-07-02 ENCOUNTER — Ambulatory Visit (INDEPENDENT_AMBULATORY_CARE_PROVIDER_SITE_OTHER): Payer: BC Managed Care – PPO | Admitting: Obstetrics and Gynecology

## 2020-07-02 ENCOUNTER — Encounter: Payer: Self-pay | Admitting: Obstetrics and Gynecology

## 2020-07-02 VITALS — BP 110/74 | HR 92 | Ht 62.0 in | Wt 140.0 lb

## 2020-07-02 DIAGNOSIS — D229 Melanocytic nevi, unspecified: Secondary | ICD-10-CM

## 2020-07-02 DIAGNOSIS — Z01419 Encounter for gynecological examination (general) (routine) without abnormal findings: Secondary | ICD-10-CM | POA: Diagnosis not present

## 2020-07-02 NOTE — Patient Instructions (Signed)

## 2020-07-03 LAB — LIPID PANEL
Chol/HDL Ratio: 3.1 ratio (ref 0.0–4.4)
Cholesterol, Total: 201 mg/dL — ABNORMAL HIGH (ref 100–199)
HDL: 65 mg/dL (ref >39)
LDL Chol Calc (NIH): 121 mg/dL — ABNORMAL HIGH (ref 0–99)
Triglycerides: 83 mg/dL (ref 0–149)
VLDL Cholesterol Cal: 15 mg/dL (ref 5–40)

## 2020-07-03 LAB — CBC
Hematocrit: 40.6 % (ref 34.0–46.6)
Hemoglobin: 13.3 g/dL (ref 11.1–15.9)
MCH: 29.6 pg (ref 26.6–33.0)
MCHC: 32.8 g/dL (ref 31.5–35.7)
MCV: 90 fL (ref 79–97)
Platelets: 157 10*3/uL (ref 150–450)
RBC: 4.49 x10E6/uL (ref 3.77–5.28)
RDW: 13.2 % (ref 11.7–15.4)
WBC: 5.3 10*3/uL (ref 3.4–10.8)

## 2020-07-03 LAB — COMPREHENSIVE METABOLIC PANEL
ALT: 13 IU/L (ref 0–32)
AST: 17 IU/L (ref 0–40)
Albumin/Globulin Ratio: 2.1 (ref 1.2–2.2)
Albumin: 4.8 g/dL (ref 3.9–5.0)
Alkaline Phosphatase: 57 IU/L (ref 44–121)
BUN/Creatinine Ratio: 15 (ref 9–23)
BUN: 11 mg/dL (ref 6–20)
Bilirubin Total: 0.6 mg/dL (ref 0.0–1.2)
CO2: 26 mmol/L (ref 20–29)
Calcium: 9.2 mg/dL (ref 8.7–10.2)
Chloride: 105 mmol/L (ref 96–106)
Creatinine, Ser: 0.71 mg/dL (ref 0.57–1.00)
GFR calc Af Amer: 138 mL/min/{1.73_m2} (ref >59)
GFR calc non Af Amer: 120 mL/min/{1.73_m2} (ref >59)
Globulin, Total: 2.3 g/dL (ref 1.5–4.5)
Glucose: 92 mg/dL (ref 65–99)
Potassium: 4 mmol/L (ref 3.5–5.2)
Sodium: 143 mmol/L (ref 134–144)
Total Protein: 7.1 g/dL (ref 6.0–8.5)

## 2021-07-02 ENCOUNTER — Telehealth: Payer: Self-pay | Admitting: Obstetrics and Gynecology

## 2021-07-02 NOTE — Telephone Encounter (Signed)
Please contact patient to schedule her annual exam and pap.  She has some pigmented areas of her vulva that need to be rechecked also.

## 2021-07-07 NOTE — Telephone Encounter (Signed)
Patient scheduled on 08/14/21

## 2021-07-07 NOTE — Telephone Encounter (Signed)
Message sent to appointments to schedule. 

## 2021-08-06 NOTE — Progress Notes (Signed)
26 y.o. G0P0000 Single African American female here for annual exam.    Regular monthly periods.   Declines pregnancy prevention.  Not trying for pregnancy in the near future.   Had visit with PCP at [facility] in November or December and she had negative STD screening.   PCP:  Carylon Perches  Patient's last menstrual period was 08/05/2020 (exact date).     Period Duration (Days): 4-5 Period Pattern: Regular Menstrual Flow: Moderate Menstrual Control: Maxi pad, Tampon Dysmenorrhea: (!) Mild Dysmenorrhea Symptoms: Cramping     Sexually active: Yes.    The current method of family planning is none.    Exercising: Yes.     pilates Smoker:  no  Health Maintenance: Pap:  06-20-18 neg History of abnormal Pap:  no MMG:  n/a Colonoscopy:  n/a BMD:   n/a  Result  n/a TDaP:  2015 Gardasil:   completed HIV: neg 2021 Hep C: neg 2021 Screening Labs:  Declined.  Flu vaccine:  declined.  Covid vaccine:  one or two boosters   reports that she has never smoked. She has never used smokeless tobacco. She reports that she does not drink alcohol and does not use drugs.  Past Medical History:  Diagnosis Date   Thrombocytopenia ([place]) 2020    History reviewed. No pertinent surgical history.  No current outpatient medications on file.   No current facility-administered medications for this visit.    Family History  Problem Relation Age of Onset   Diabetes Mother    Hypertension Mother    Hyperlipidemia Mother    Hypertension Father    Diabetes Maternal Grandmother    Hypertension Maternal Grandmother    Hypertension Maternal Grandfather     Review of Systems  Constitutional: Negative.   HENT: Negative.    Eyes: Negative.   Respiratory: Negative.    Cardiovascular: Negative.   Gastrointestinal: Negative.   Endocrine: Negative.   Genitourinary: Negative.   Musculoskeletal: Negative.   Skin: Negative.   Allergic/Immunologic: Negative.   Neurological: Negative.    Hematological: Negative.   Psychiatric/Behavioral: Negative.     Exam:   BP 108/70    Pulse 90    Resp 16    Ht 5' 1.25" (1.556 m)    Wt 144 lb (65.3 kg)    LMP 08/05/2020 (Exact Date)    BMI 26.99 kg/m     General appearance: alert, cooperative and appears stated age Head: normocephalic, without obvious abnormality, atraumatic Neck: no adenopathy, supple, symmetrical, trachea midline and thyroid normal to inspection and palpation Lungs: clear to auscultation bilaterally Breasts: normal appearance, no masses or tenderness, No nipple retraction or dimpling, No nipple discharge or bleeding, No axillary adenopathy Heart: regular rate and rhythm Abdomen: soft, non-tender; no masses, no organomegaly Extremities: extremities normal, atraumatic, no cyanosis or edema Skin: skin color, texture, turgor normal. No rashes or lesions Lymph nodes: cervical, supraclavicular, and axillary nodes normal. Neurologic: grossly normal  Pelvic: External genitalia:  has two 3 mm darkly pigmented nevi of left vulva/buttock interface.                No abnormal inguinal nodes palpated.              Urethra:  normal appearing urethra with no masses, tenderness or lesions              Bartholins and Skenes: normal                 Vagina: normal appearing vagina with normal  color and discharge, no lesions              Cervix: no lesions.  Small amount of dark clot at os and on cervix.               Pap taken: yes Bimanual Exam:  Uterus:  normal size, contour, position, consistency, mobility, non-tender              Adnexa: no mass, fullness, tenderness               Chaperone was present for exam:  Joy, CMA  Assessment:   Well woman visit with gynecologic exam. Pigmented nevi.   Plan: Mammogram screening discussed. Self breast awareness reviewed. Pap and reflex HR HPV as above. Guidelines for Calcium, Vitamin D, regular exercise program including cardiovascular and weight bearing exercise. Will monitor  pigmented nevi.  Patient is aware and will call if there is a change.  Follow up annually and prn.   After visit summary provided.

## 2021-08-14 ENCOUNTER — Encounter: Payer: Self-pay | Admitting: Obstetrics and Gynecology

## 2021-08-14 ENCOUNTER — Other Ambulatory Visit: Payer: Self-pay

## 2021-08-14 ENCOUNTER — Ambulatory Visit (INDEPENDENT_AMBULATORY_CARE_PROVIDER_SITE_OTHER): Payer: BC Managed Care – PPO | Admitting: Obstetrics and Gynecology

## 2021-08-14 ENCOUNTER — Other Ambulatory Visit (HOSPITAL_COMMUNITY)
Admission: RE | Admit: 2021-08-14 | Discharge: 2021-08-14 | Disposition: A | Discharge status: Home / Self Care | Payer: BC Managed Care – PPO | Source: Ambulatory Visit | Attending: Obstetrics and Gynecology | Admitting: Obstetrics and Gynecology

## 2021-08-14 VITALS — BP 108/70 | HR 90 | Resp 16 | Ht 61.25 in | Wt 144.0 lb

## 2021-08-14 DIAGNOSIS — Z01419 Encounter for gynecological examination (general) (routine) without abnormal findings: Secondary | ICD-10-CM

## 2021-08-14 DIAGNOSIS — Z124 Encounter for screening for malignant neoplasm of cervix: Secondary | ICD-10-CM

## 2021-08-14 NOTE — Patient Instructions (Signed)

## 2021-08-17 LAB — CYTOLOGY - PAP: Diagnosis: NEGATIVE

## 2022-05-12 ENCOUNTER — Ambulatory Visit: Payer: BC Managed Care – PPO | Admitting: Radiology

## 2022-05-12 DIAGNOSIS — Z0289 Encounter for other administrative examinations: Secondary | ICD-10-CM
